# Patient Record
Sex: Female | Born: 1970 | Race: White | Hispanic: No | Marital: Married | Smoking: Never smoker
Health system: Southern US, Community
[De-identification: ages and names within clinical notes are randomized; demographics above are authoritative.]

## PROBLEM LIST (undated history)

## (undated) DIAGNOSIS — F32A Depression, unspecified: Secondary | ICD-10-CM

## (undated) DIAGNOSIS — F419 Anxiety disorder, unspecified: Secondary | ICD-10-CM

## (undated) DIAGNOSIS — G43909 Migraine, unspecified, not intractable, without status migrainosus: Secondary | ICD-10-CM

## (undated) DIAGNOSIS — T7840XA Allergy, unspecified, initial encounter: Secondary | ICD-10-CM

## (undated) DIAGNOSIS — E785 Hyperlipidemia, unspecified: Secondary | ICD-10-CM

## (undated) DIAGNOSIS — K219 Gastro-esophageal reflux disease without esophagitis: Secondary | ICD-10-CM

## (undated) HISTORY — PX: AUGMENTATION MAMMAPLASTY: SUR837

## (undated) HISTORY — DX: Allergy, unspecified, initial encounter: T78.40XA

## (undated) HISTORY — DX: Gastro-esophageal reflux disease without esophagitis: K21.9

## (undated) HISTORY — DX: Migraine, unspecified, not intractable, without status migrainosus: G43.909

## (undated) HISTORY — DX: Hyperlipidemia, unspecified: E78.5

## (undated) HISTORY — DX: Depression, unspecified: F32.A

## (undated) HISTORY — PX: ABDOMINAL HYSTERECTOMY: SHX81

## (undated) HISTORY — DX: Anxiety disorder, unspecified: F41.9

---

## 1998-08-31 ENCOUNTER — Other Ambulatory Visit: Admission: RE | Admit: 1998-08-31 | Discharge: 1998-08-31 | Payer: Self-pay | Admitting: Obstetrics and Gynecology

## 1999-08-23 ENCOUNTER — Other Ambulatory Visit: Admission: RE | Admit: 1999-08-23 | Discharge: 1999-08-23 | Payer: Self-pay | Admitting: Obstetrics and Gynecology

## 1999-11-08 ENCOUNTER — Other Ambulatory Visit: Admission: RE | Admit: 1999-11-08 | Discharge: 1999-11-08 | Payer: Self-pay | Admitting: Obstetrics and Gynecology

## 2001-01-06 ENCOUNTER — Other Ambulatory Visit: Admission: RE | Admit: 2001-01-06 | Discharge: 2001-01-06 | Payer: Self-pay | Admitting: *Deleted

## 2002-01-21 ENCOUNTER — Other Ambulatory Visit: Admission: RE | Admit: 2002-01-21 | Discharge: 2002-01-21 | Payer: Self-pay | Admitting: *Deleted

## 2003-01-10 ENCOUNTER — Other Ambulatory Visit: Admission: RE | Admit: 2003-01-10 | Discharge: 2003-01-10 | Payer: Self-pay | Admitting: *Deleted

## 2004-06-13 ENCOUNTER — Other Ambulatory Visit: Admission: RE | Admit: 2004-06-13 | Discharge: 2004-06-13 | Payer: Self-pay | Admitting: Obstetrics and Gynecology

## 2005-01-16 ENCOUNTER — Other Ambulatory Visit: Admission: RE | Admit: 2005-01-16 | Discharge: 2005-01-16 | Payer: Self-pay | Admitting: Obstetrics and Gynecology

## 2005-04-23 ENCOUNTER — Other Ambulatory Visit: Admission: RE | Admit: 2005-04-23 | Discharge: 2005-04-23 | Payer: Self-pay | Admitting: Obstetrics and Gynecology

## 2006-02-12 ENCOUNTER — Encounter: Admission: RE | Admit: 2006-02-12 | Discharge: 2006-02-12 | Payer: Self-pay | Admitting: Obstetrics and Gynecology

## 2010-01-25 ENCOUNTER — Encounter: Admission: RE | Admit: 2010-01-25 | Discharge: 2010-01-25 | Payer: Self-pay | Admitting: Obstetrics and Gynecology

## 2010-06-30 ENCOUNTER — Encounter: Payer: Self-pay | Admitting: Obstetrics and Gynecology

## 2010-11-21 ENCOUNTER — Other Ambulatory Visit (HOSPITAL_COMMUNITY): Payer: 59

## 2010-11-26 ENCOUNTER — Encounter (HOSPITAL_COMMUNITY)
Admission: RE | Admit: 2010-11-26 | Discharge: 2010-11-26 | Disposition: A | Discharge status: Home / Self Care | Payer: 59 | Source: Ambulatory Visit | Attending: Obstetrics and Gynecology | Admitting: Obstetrics and Gynecology

## 2010-11-26 LAB — BASIC METABOLIC PANEL
CO2: 27 mEq/L (ref 19–32)
Calcium: 9.2 mg/dL (ref 8.4–10.5)
Chloride: 103 mEq/L (ref 96–112)
GFR calc Af Amer: >60 mL/min (ref >60)
Sodium: 138 mEq/L (ref 135–145)

## 2010-11-26 LAB — SURGICAL PCR SCREEN
MRSA, PCR: NEGATIVE
Staphylococcus aureus: NEGATIVE

## 2010-11-26 LAB — CBC
MCH: 30.7 pg (ref 26.0–34.0)
Platelets: 198 10*3/uL (ref 150–400)
RBC: 4.33 MIL/uL (ref 3.87–5.11)
WBC: 4 10*3/uL (ref 4.0–10.5)

## 2010-11-28 ENCOUNTER — Inpatient Hospital Stay (HOSPITAL_COMMUNITY)
Admission: AD | Admit: 2010-11-28 | Discharge: 2010-11-29 | DRG: 743 | Disposition: A | Discharge status: Home / Self Care | Payer: 59 | Source: Ambulatory Visit | Attending: Obstetrics and Gynecology | Admitting: Obstetrics and Gynecology

## 2010-11-28 ENCOUNTER — Other Ambulatory Visit: Payer: Self-pay | Admitting: Obstetrics and Gynecology

## 2010-11-28 DIAGNOSIS — Z5331 Laparoscopic surgical procedure converted to open procedure: Secondary | ICD-10-CM

## 2010-11-28 DIAGNOSIS — N920 Excessive and frequent menstruation with regular cycle: Principal | ICD-10-CM | POA: Diagnosis present

## 2010-11-28 DIAGNOSIS — N8000 Endometriosis of the uterus, unspecified: Secondary | ICD-10-CM | POA: Diagnosis present

## 2010-11-28 DIAGNOSIS — Z01812 Encounter for preprocedural laboratory examination: Secondary | ICD-10-CM

## 2010-11-28 DIAGNOSIS — Z01818 Encounter for other preprocedural examination: Secondary | ICD-10-CM

## 2010-11-28 DIAGNOSIS — D251 Intramural leiomyoma of uterus: Secondary | ICD-10-CM | POA: Diagnosis present

## 2010-11-29 LAB — CBC
MCH: 31 pg (ref 26.0–34.0)
MCHC: 33.8 g/dL (ref 30.0–36.0)
Platelets: 185 10*3/uL (ref 150–400)
RBC: 3.19 MIL/uL — ABNORMAL LOW (ref 3.87–5.11)
RDW: 13.2 % (ref 11.5–15.5)

## 2010-11-29 NOTE — Op Note (Signed)
Darlene Klein, Darlene Klein                ACCOUNT NO.:  [id number]  MEDICAL RECORD NO.:  [id number]  LOCATION:  9312                          FACILITY:  [facility]  PHYSICIAN:  Kaysa Roulhac L. Yancy Knoble, M.D.DATE OF BIRTH:  [birth year]  DATE OF PROCEDURE:  11/28/2010 DATE OF DISCHARGE:                              OPERATIVE REPORT   PREOPERATIVE DIAGNOSES:  Symptomatic fibroid, menorrhagia.  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  PROCEDURES: 1. Laparoscopic-assisted vaginal hysterectomy. 2. Mini laparotomy. 3. Resection of the fibroid in the broad ligament.  SURGEON:  Darlene Atienza L. Vincente Poli, MD  ASSISTANT:  Dr. Marcelle Overlie  ANESTHESIA:  General.  FINDINGS:  A 6-cm fibroid densely adherent to the left side wall not attached to the uterus.  SPECIMENS:  Cervix, uterus and fibroid sent to Pathology.  ESTIMATED BLOOD LOSS:  300 mL.  COMPLICATIONS:  None.  PROCEDURE IN DETAILS:  The patient was taken to the operating room.  She was intubated.  She was then prepped and draped in usual sterile fashion.  A Foley catheter was inserted and draining clear urine.  An uterine manipulator was inserted.  Attention was turned to the abdomen where small infraumbilical incision was made. A Veress needle was inserted and pneumoperitoneum was performed.  After pneumoperitoneum was performed, an 11-mm trocar was inserted.  The patient was gently placed in Trendelenburg position.  The endoscope was introduced into the trocar sheath.  A small 5-mm trocar was placed suprapubically under direct visualization.  Exam of abdomen and pelvis revealed the uterus was enlarged.  The ovaries and tubes were normal.  There was a large bulge in the left broad ligament which was consistent with the broad ligament fibroid.  I could not tell if it is contiguous or attached to the uterus, but it did move when we moved the uterus.  We used  the EnSeal instrument and proceeded to perform the LAVH in the following fashion by identifying the triple pedicle initially on the left and the right grasping that with the EnSeal, burning it and cutting it and carrying it down to the round ligament.  This was done on both sides with excellent hemostasis.  At this point we then went back down to the vagina, placed a weighted speculum in the vagina.  I made a circumferential incision around the cervix.  I entered the posterior and then the anterior cul-de- sac using Mayo scissors.  Curved Heaney clamps were used to place across the uterosacral ligaments.  Each pedicle was clamped, cut and suture ligated using Vicryl suture.  We never ran into the fibroid on the left side so that made me concern that the fibroid was not contiguous with the uterus.  We then proceeded with staying snug against the uterus traveling the uterus.  Each pedicle was clamped, cut and suture ligated using 0 Vicryl suture.  We easily reached the fundus.  We retroflexed the uterus and clamps were remained to the broad ligament on either side and then the specimen was removed.  I did not see a large subserosal fibroid in the specimen but it looked like uterus and cervix.  The pedicles were secured using suture ligature and free tie of 0 Vicryl suture.  At this point ovaries appeared normal and then secured the posterior cuff with 0 Vicryl suture and closed the remainder of the cuff completely anterior to posterior using 0 Vicryl suture.  At this point I went back up to the abdomen, reintroduced the pneumoperitoneum, irrigated the pelvis.  I could still visualize there was very large broad ligament fibroid that was densely adherent to the sidewall.  Based on the vascularity and the location to vital structures, I felt necessary to perform a minilaparotomy because this could not be done via a laparoscopic approach.  I went down to remove the scope and then made a small  minilaparotomy incision, carried it down to the fascia, fascia was scored in the midline, extended laterally.  Rectus muscles were separated in midline.  Peritoneum was entered bluntly.  I then placed a self-retaining retractor in the abdominal cavity, placed a large and small bowel in the upper abdomen.  At this point reinspected the fibroid.  I could grasp it with a single-tooth tenaculum.  I was able to elevate it.  There was a lot of vascularity with this that was feeding off to the sidewall, so carefully used sharp and blunt dissection peeling the fibroid out and having to use curved Heaney clamps to clamp some of the vessels.  Each pedicle was secured using suture ligature of 0 Vicryl suture.  We also paid careful attention to avoid the ureter which was well below where the fibroid was.  Once we resected the fibroid, it was about 6-8 cm in diameter and sent to Pathology.  I then irrigated the pelvis.  There was a small bleeder on the edge of the peritoneum on the left side.  I placed a curved Heaney clamp there and I was secured with a free tie of 0 Vicryl suture.  Remainder of the pelvis was inspected.  Hemostasis was excellent.  Surgicel was placed on the left side wall because where the fibroid was, there was kind of a large roll stayed and then I placed Interceed across the vaginal cuff and across the left side wall to hopefully minimize adhesion formation later on.  All sponges and instruments were removed from the abdominal cavity. The peritoneum was closed using 0 Vicryl.  Rectus muscles were reapproximated with 0 Vicryl, the fascia was closed using 0 Vicryl in a running stitch x2 starting each corner meeting in the midline.  After irrigation of subcutaneous layer, the skin was closed with a 3-0 Vicryl on a Keith needle.  Dermabond was applied and a laparoscopic site was closed with 3-0 Vicryl interrupted.  All sponge, lap and instrument counts were correct x2.  The patient  went to recovery room in stable condition.     Darlene Klein L. Vincente Klein, M.D.     Darlene Klein  D:  11/28/2010  T:  11/29/2010  Job:  [id number]  Electronically Signed by Marcelle Overlie M.D. on 11/29/2010 07:42:59 PM

## 2011-01-01 ENCOUNTER — Other Ambulatory Visit: Payer: Self-pay | Admitting: Obstetrics and Gynecology

## 2011-01-01 DIAGNOSIS — Z1231 Encounter for screening mammogram for malignant neoplasm of breast: Secondary | ICD-10-CM

## 2011-01-29 ENCOUNTER — Ambulatory Visit
Admission: RE | Admit: 2011-01-29 | Discharge: 2011-01-29 | Disposition: A | Discharge status: Home / Self Care | Payer: 59 | Source: Ambulatory Visit | Attending: Obstetrics and Gynecology | Admitting: Obstetrics and Gynecology

## 2011-01-29 DIAGNOSIS — Z1231 Encounter for screening mammogram for malignant neoplasm of breast: Secondary | ICD-10-CM

## 2011-09-25 ENCOUNTER — Other Ambulatory Visit: Payer: Self-pay | Admitting: Obstetrics and Gynecology

## 2012-08-06 ENCOUNTER — Other Ambulatory Visit: Payer: Self-pay | Admitting: Obstetrics and Gynecology

## 2012-08-06 ENCOUNTER — Other Ambulatory Visit: Payer: Self-pay

## 2012-08-06 DIAGNOSIS — Z1231 Encounter for screening mammogram for malignant neoplasm of breast: Secondary | ICD-10-CM

## 2012-09-02 ENCOUNTER — Ambulatory Visit
Admission: RE | Admit: 2012-09-02 | Discharge: 2012-09-02 | Disposition: A | Discharge status: Home / Self Care | Payer: 59 | Source: Ambulatory Visit

## 2012-09-02 DIAGNOSIS — Z1231 Encounter for screening mammogram for malignant neoplasm of breast: Secondary | ICD-10-CM

## 2013-01-26 ENCOUNTER — Other Ambulatory Visit: Payer: Self-pay | Admitting: Obstetrics and Gynecology

## 2013-09-22 ENCOUNTER — Other Ambulatory Visit: Payer: Self-pay

## 2013-09-22 DIAGNOSIS — Z1231 Encounter for screening mammogram for malignant neoplasm of breast: Secondary | ICD-10-CM

## 2013-09-29 ENCOUNTER — Encounter (INDEPENDENT_AMBULATORY_CARE_PROVIDER_SITE_OTHER): Payer: Self-pay

## 2013-09-29 ENCOUNTER — Ambulatory Visit
Admission: RE | Admit: 2013-09-29 | Discharge: 2013-09-29 | Disposition: A | Discharge status: Home / Self Care | Payer: Self-pay | Source: Ambulatory Visit

## 2013-09-29 DIAGNOSIS — Z1231 Encounter for screening mammogram for malignant neoplasm of breast: Secondary | ICD-10-CM

## 2014-10-05 ENCOUNTER — Other Ambulatory Visit: Payer: Self-pay

## 2014-10-05 DIAGNOSIS — Z1231 Encounter for screening mammogram for malignant neoplasm of breast: Secondary | ICD-10-CM

## 2014-10-11 ENCOUNTER — Other Ambulatory Visit: Payer: Self-pay

## 2014-10-11 ENCOUNTER — Ambulatory Visit
Admission: RE | Admit: 2014-10-11 | Discharge: 2014-10-11 | Disposition: A | Discharge status: Home / Self Care | Payer: PRIVATE HEALTH INSURANCE | Source: Ambulatory Visit

## 2014-10-11 DIAGNOSIS — Z1231 Encounter for screening mammogram for malignant neoplasm of breast: Secondary | ICD-10-CM

## 2014-10-31 ENCOUNTER — Other Ambulatory Visit: Payer: Self-pay | Admitting: Obstetrics and Gynecology

## 2014-11-01 LAB — CYTOLOGY - PAP

## 2015-05-09 ENCOUNTER — Ambulatory Visit (INDEPENDENT_AMBULATORY_CARE_PROVIDER_SITE_OTHER): Payer: PRIVATE HEALTH INSURANCE

## 2015-05-09 ENCOUNTER — Encounter: Payer: Self-pay | Admitting: Podiatry

## 2015-05-09 ENCOUNTER — Ambulatory Visit (INDEPENDENT_AMBULATORY_CARE_PROVIDER_SITE_OTHER): Payer: PRIVATE HEALTH INSURANCE | Admitting: Podiatry

## 2015-05-09 VITALS — BP 115/84 | HR 75 | Resp 16

## 2015-05-09 DIAGNOSIS — Q6652 Congenital pes planus, left foot: Secondary | ICD-10-CM

## 2015-05-09 DIAGNOSIS — M722 Plantar fascial fibromatosis: Secondary | ICD-10-CM

## 2015-05-09 MED ORDER — METHYLPREDNISOLONE 4 MG PO TBPK: ORAL_TABLET | ORAL | Status: DC

## 2015-05-09 MED ORDER — MELOXICAM 15 MG PO TABS: 15.0000 mg | ORAL_TABLET | Freq: Every day | ORAL | Status: DC

## 2015-05-09 NOTE — Progress Notes (Signed)
   Subjective:    Patient ID: Darlene Klein, female    DOB: 08/27/1970, 44 y.o.   MRN: [id number]  HPI: She presents today for several month duration of heel pain left. 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 She's tried meloxicam for the past 2 weeks as well as an injection to her left heel which she performed herself with 20 mg of Kenalog and local anesthetic. She's also been using a night brace. She is an avid runner and has discontinued running at this time. She does not recall any initiating event.    Review of Systems  All other systems reviewed and are negative.      Objective:   Physical Exam: 44 year old white female presents with left heel pain times several months vital signs stable alert and oriented 3 in no acute distress. Pulses are strongly palpable left neurologic sensorium is intact deep tendon reflexes are intact muscle strength is normal. She has normal gait. She has pain on palpation medial calcaneal tubercle of her left heel. No tenderness on palpation of her Achilles. Radiographic evaluation demonstrates soft tissue increase in density of the plantar fascial calcaneal insertion site of her left heel. No fractures are identified. Cutaneous evaluation demonstrates supple well-hydrated cutis no erythema or edema cellulitis drainage or odor.        Assessment & Plan:  Plantar fasciitis left foot.  Plan: I offered her another injection to the central band of the left plantar fascia insertion site. She declined. I offered her a plantar fascial night splint and she declined. I placed her in a plantar fascial brace and she was scanned for several orthotics. I also prescribed a Medrol Dosepak to be followed by meloxicam. We discussed appropriate shoe gear to exercises ice therapy and shear modifications. I will follow up with her once her orthotics have arrived.  Roselind Messier DPM

## 2015-05-09 NOTE — Patient Instructions (Signed)

## 2015-06-01 ENCOUNTER — Encounter: Payer: Self-pay | Admitting: Podiatry

## 2015-06-06 ENCOUNTER — Ambulatory Visit: Payer: PRIVATE HEALTH INSURANCE | Admitting: Podiatry

## 2015-06-27 ENCOUNTER — Ambulatory Visit (INDEPENDENT_AMBULATORY_CARE_PROVIDER_SITE_OTHER): Payer: PRIVATE HEALTH INSURANCE | Admitting: Podiatry

## 2015-06-27 ENCOUNTER — Encounter: Payer: Self-pay | Admitting: Podiatry

## 2015-06-27 DIAGNOSIS — M722 Plantar fascial fibromatosis: Secondary | ICD-10-CM | POA: Diagnosis not present

## 2015-06-27 NOTE — Patient Instructions (Signed)

## 2015-06-27 NOTE — Progress Notes (Signed)
She presents today for follow-up of her plantar fasciitis bilaterally. She states that she's doing pretty well. She presents to pick up her orthotics today.  Objective: Vital signs are stable she is alert and oriented 3. Pulses are palpable no pain on palpation medial calcaneal tubercles. Orthotics appear to be fitting correctly.  Assessment: Well-healing plantar fasciitis.  Plan: Start use of orthotics follow up with me in 1 month. She will continue all other conservative therapies will notify us with any questions or concerns.

## 2015-10-08 ENCOUNTER — Other Ambulatory Visit: Payer: Self-pay

## 2015-10-08 DIAGNOSIS — Z1231 Encounter for screening mammogram for malignant neoplasm of breast: Secondary | ICD-10-CM

## 2015-11-09 ENCOUNTER — Ambulatory Visit
Admission: RE | Admit: 2015-11-09 | Discharge: 2015-11-09 | Disposition: A | Discharge status: Home / Self Care | Payer: PRIVATE HEALTH INSURANCE | Source: Ambulatory Visit

## 2015-11-09 DIAGNOSIS — Z1231 Encounter for screening mammogram for malignant neoplasm of breast: Secondary | ICD-10-CM

## 2017-02-23 ENCOUNTER — Other Ambulatory Visit: Payer: Self-pay | Admitting: Physician Assistant

## 2017-02-23 DIAGNOSIS — Z1239 Encounter for other screening for malignant neoplasm of breast: Secondary | ICD-10-CM

## 2017-03-04 ENCOUNTER — Ambulatory Visit
Admission: RE | Admit: 2017-03-04 | Discharge: 2017-03-04 | Disposition: A | Discharge status: Home / Self Care | Payer: Commercial Managed Care - PPO | Source: Ambulatory Visit | Attending: Physician Assistant | Admitting: Physician Assistant

## 2017-03-04 DIAGNOSIS — Z1239 Encounter for other screening for malignant neoplasm of breast: Secondary | ICD-10-CM

## 2018-05-26 ENCOUNTER — Other Ambulatory Visit: Payer: Self-pay | Admitting: Physician Assistant

## 2018-05-26 DIAGNOSIS — Z1231 Encounter for screening mammogram for malignant neoplasm of breast: Secondary | ICD-10-CM

## 2018-07-07 ENCOUNTER — Ambulatory Visit: Payer: Commercial Managed Care - PPO

## 2018-08-04 ENCOUNTER — Ambulatory Visit
Admission: RE | Admit: 2018-08-04 | Discharge: 2018-08-04 | Disposition: A | Discharge status: Home / Self Care | Payer: Commercial Managed Care - PPO | Source: Ambulatory Visit | Attending: Physician Assistant | Admitting: Physician Assistant

## 2018-08-04 DIAGNOSIS — Z1231 Encounter for screening mammogram for malignant neoplasm of breast: Secondary | ICD-10-CM

## 2019-10-06 ENCOUNTER — Other Ambulatory Visit: Payer: Self-pay | Admitting: Physician Assistant

## 2019-10-06 DIAGNOSIS — Z1231 Encounter for screening mammogram for malignant neoplasm of breast: Secondary | ICD-10-CM

## 2019-10-12 ENCOUNTER — Other Ambulatory Visit: Payer: Self-pay | Admitting: Physician Assistant

## 2019-10-12 ENCOUNTER — Other Ambulatory Visit: Payer: Self-pay

## 2019-10-12 ENCOUNTER — Ambulatory Visit
Admission: RE | Admit: 2019-10-12 | Discharge: 2019-10-12 | Disposition: A | Discharge status: Home / Self Care | Payer: Commercial Managed Care - PPO | Source: Ambulatory Visit | Attending: Physician Assistant | Admitting: Physician Assistant

## 2019-10-12 DIAGNOSIS — Z1231 Encounter for screening mammogram for malignant neoplasm of breast: Secondary | ICD-10-CM

## 2019-10-12 DIAGNOSIS — R921 Mammographic calcification found on diagnostic imaging of breast: Secondary | ICD-10-CM

## 2019-10-19 ENCOUNTER — Other Ambulatory Visit: Payer: Self-pay

## 2019-10-19 ENCOUNTER — Other Ambulatory Visit: Payer: Self-pay | Admitting: Physician Assistant

## 2019-10-19 ENCOUNTER — Ambulatory Visit
Admission: RE | Admit: 2019-10-19 | Discharge: 2019-10-19 | Disposition: A | Discharge status: Home / Self Care | Payer: Commercial Managed Care - PPO | Source: Ambulatory Visit | Attending: Physician Assistant | Admitting: Physician Assistant

## 2019-10-19 DIAGNOSIS — R921 Mammographic calcification found on diagnostic imaging of breast: Secondary | ICD-10-CM

## 2020-04-24 ENCOUNTER — Encounter: Payer: Self-pay | Admitting: Gastroenterology

## 2020-06-13 ENCOUNTER — Ambulatory Visit (AMBULATORY_SURGERY_CENTER): Payer: Self-pay

## 2020-06-13 ENCOUNTER — Other Ambulatory Visit: Payer: Self-pay

## 2020-06-13 VITALS — Ht 65.0 in | Wt 154.0 lb

## 2020-06-13 DIAGNOSIS — Z1211 Encounter for screening for malignant neoplasm of colon: Secondary | ICD-10-CM

## 2020-06-13 MED ORDER — SUTAB 1479-225-188 MG PO TABS
12.0000 | ORAL_TABLET | ORAL | 0 refills | Status: DC
Start: 2020-06-13 — End: 2020-06-27

## 2020-06-13 NOTE — Progress Notes (Signed)
No allergies to soy or egg Pt is not on blood thinners or diet pills Denies issues with sedation/intubation Denies atrial flutter/fib Chronic constipation-takes laxative every couple of days. Prep changed to 2 day.  Emmi instructions given to pt  Pt is aware of Covid safety and care partner requirements.

## 2020-06-25 ENCOUNTER — Encounter: Payer: Self-pay | Admitting: Gastroenterology

## 2020-06-27 ENCOUNTER — Ambulatory Visit (AMBULATORY_SURGERY_CENTER): Payer: Commercial Managed Care - PPO | Admitting: Gastroenterology

## 2020-06-27 ENCOUNTER — Encounter: Payer: Self-pay | Admitting: Gastroenterology

## 2020-06-27 ENCOUNTER — Other Ambulatory Visit: Payer: Self-pay

## 2020-06-27 VITALS — BP 118/76 | HR 64 | Temp 97.3°F | Resp 15 | Ht 65.0 in | Wt 154.0 lb

## 2020-06-27 DIAGNOSIS — Z1211 Encounter for screening for malignant neoplasm of colon: Secondary | ICD-10-CM

## 2020-06-27 MED ORDER — SODIUM CHLORIDE 0.9 % IV SOLN: 500.0000 mL | Freq: Once | INTRAVENOUS | Status: DC

## 2020-06-27 NOTE — Patient Instructions (Signed)
Discharge instructions given. Normal exam. Resume previous medications. YOU HAD AN ENDOSCOPIC PROCEDURE TODAY AT THE  ENDOSCOPY CENTER:   Refer to the procedure report that was given to you for any specific questions about what was found during the examination.  If the procedure report does not answer your questions, please call your gastroenterologist to clarify.  If you requested that your care partner not be given the details of your procedure findings, then the procedure report has been included in a sealed envelope for you to review at your convenience later.  YOU SHOULD EXPECT: Some feelings of bloating in the abdomen. Passage of more gas than usual.  Walking can help get rid of the air that was put into your GI tract during the procedure and reduce the bloating. If you had a lower endoscopy (such as a colonoscopy or flexible sigmoidoscopy) you may notice spotting of blood in your stool or on the toilet paper. If you underwent a bowel prep for your procedure, you may not have a normal bowel movement for a few days.  Please Note:  You might notice some irritation and congestion in your nose or some drainage.  This is from the oxygen used during your procedure.  There is no need for concern and it should clear up in a day or so.  SYMPTOMS TO REPORT IMMEDIATELY:  Following lower endoscopy (colonoscopy or flexible sigmoidoscopy):  Excessive amounts of blood in the stool  Significant tenderness or worsening of abdominal pains  Swelling of the abdomen that is new, acute  Fever of 100F or higher   For urgent or emergent issues, a gastroenterologist can be reached at any hour by calling [phone]. Do not use MyChart messaging for urgent concerns.    DIET:  We do recommend a small meal at first, but then you may proceed to your regular diet.  Drink plenty of fluids but you should avoid alcoholic beverages for 24 hours.  ACTIVITY:  You should plan to take it easy for the rest of  today and you should NOT DRIVE or use heavy machinery until tomorrow (because of the sedation medicines used during the test).    FOLLOW UP: Our staff will call the number listed on your records 48-72 hours following your procedure to check on you and address any questions or concerns that you may have regarding the information given to you following your procedure. If we do not reach you, we will leave a message.  We will attempt to reach you two times.  During this call, we will ask if you have developed any symptoms of COVID 19. If you develop any symptoms (ie: fever, flu-like symptoms, shortness of breath, cough etc.) before then, please call [phone].  If you test positive for Covid 19 in the 2 weeks post procedure, please call and report this information to us.    If any biopsies were taken you will be contacted by phone or by letter within the next 1-3 weeks.  Please call us at [phone] if you have not heard about the biopsies in 3 weeks.    SIGNATURES/CONFIDENTIALITY: You and/or your care partner have signed paperwork which will be entered into your electronic medical record.  These signatures attest to the fact that that the information above on your After Visit Summary has been reviewed and is understood.  Full responsibility of the confidentiality of this discharge information lies with you and/or your care-partner.  

## 2020-06-27 NOTE — Progress Notes (Signed)
A/ox3, pleased with MAC, report to RN 

## 2020-06-27 NOTE — Op Note (Signed)
[facility] Patient Name: Darlene Klein Procedure Date: 06/27/2020 1:31 PM MRN: [id number] Endoscopist: Thornton Park MD, MD Age: 50 Referring MD:  Date of Birth: [birth year] Gender: Female Account #: [id number] Procedure:                Colonoscopy Indications:              Screening for colorectal malignant neoplasm, This                            is the patient's first colonoscopy                           No known family history of colon cancer or polyps                           Incidental history: chronic constipation Medicines:                Monitored Anesthesia Care Procedure:                Pre-Anesthesia Assessment:                           - Prior to the procedure, a History and Physical                            was performed, and patient medications and                            allergies were reviewed. The patient's tolerance of                            previous anesthesia was also reviewed. The risks                            and benefits of the procedure and the sedation                            options and risks were discussed with the patient.                            All questions were answered, and informed consent                            was obtained. Prior Anticoagulants: The patient has                            taken no previous anticoagulant or antiplatelet                            agents. ASA Grade Assessment: II - A patient with                            mild systemic disease. After reviewing the risks  and benefits, the patient was deemed in                            satisfactory condition to undergo the procedure.                           After obtaining informed consent, the colonoscope                            was passed under direct vision. Throughout the                            procedure, the patient's blood pressure, pulse, and                            oxygen saturations were monitored  continuously. The                            Olympus CF-HQ190L [id number]) Colonoscope was                            introduced through the anus and advanced to the the                            cecum, identified by appendiceal orifice and                            ileocecal valve. The colonoscopy was technically                            difficult and complex due to a redundant colon,                            significant looping and a tortuous colon.                            Successful completion of the procedure was aided by                            changing the patient's position, withdrawing and                            reinserting the scope and applying abdominal                            pressure. The patient tolerated the procedure well.                            The quality of the bowel preparation was good. The                            ileocecal valve, appendiceal orifice, and rectum  were photographed. Scope In: 1:39:50 PM Scope Out: 2:05:21 PM Scope Withdrawal Time: 0 hours 15 minutes 16 seconds  Total Procedure Duration: 0 hours 25 minutes 31 seconds  Findings:                 The perianal and digital rectal examinations were                            normal except for small hemorrhoids.                           The entire examined colon appeared normal on direct                            and retroflexion views. However, the colon is                            tortuous and redundant. Complications:            No immediate complications. Estimated Blood Loss:     Estimated blood loss: none. Impression:               - Tortuous and redundant colon. Otherwise, normal                            colonic mucosa.                           - No specimens collected. Recommendation:           - Patient has a contact number available for                            emergencies. The signs and symptoms of potential                             delayed complications were discussed with the                            patient. Return to normal activities tomorrow.                            Written discharge instructions were provided to the                            patient.                           - Resume previous diet.                           - Continue present medications.                           - Repeat colonoscopy in 10 years for screening                            purposes.                           -  Emerging evidence supports eating a diet of                            fruits, vegetables, grains, calcium, and yogurt                            while reducing red meat and alcohol may reduce the                            risk of colon cancer.                           - Thank you for allowing me to be involved in your                            colon cancer prevention. Thornton Park MD, MD 06/27/2020 2:12:23 PM This report has been signed electronically.

## 2020-06-27 NOTE — Progress Notes (Signed)
Pt's states no medical or surgical changes since previsit or office visit.   VS taken by CW 

## 2020-06-29 ENCOUNTER — Telehealth: Payer: Self-pay

## 2020-06-29 NOTE — Telephone Encounter (Signed)
°  Follow up Call-  Call back number 06/27/2020  Post procedure Call Back phone  # [phone]  Permission to leave phone message Yes  Some recent data might be hidden     Patient questions:  Do you have a fever, pain , or abdominal swelling? No. Pain Score  0 *  Have you tolerated food without any problems? Yes.    Have you been able to return to your normal activities? Yes.    Do you have any questions about your discharge instructions: Diet   No. Medications  No. Follow up visit  No.  Do you have questions or concerns about your Care? No.  Actions: * If pain score is 4 or above: No action needed, pain <4. 1. Have you developed a fever since your procedure? no  2.   Have you had an respiratory symptoms (SOB or cough) since your procedure? no  3.   Have you tested positive for COVID 19 since your procedure no  4.   Have you had any family members/close contacts diagnosed with the COVID 19 since your procedure?  no   If yes to any of these questions please route to Joylene John, RN and Joella Prince, RN

## 2020-09-19 ENCOUNTER — Other Ambulatory Visit: Payer: Self-pay | Admitting: Physician Assistant

## 2020-09-19 DIAGNOSIS — Z1231 Encounter for screening mammogram for malignant neoplasm of breast: Secondary | ICD-10-CM

## 2020-11-19 ENCOUNTER — Ambulatory Visit: Payer: Commercial Managed Care - PPO

## 2021-01-09 ENCOUNTER — Other Ambulatory Visit: Payer: Self-pay

## 2021-01-09 ENCOUNTER — Ambulatory Visit
Admission: RE | Admit: 2021-01-09 | Discharge: 2021-01-09 | Disposition: A | Discharge status: Home / Self Care | Payer: Commercial Managed Care - PPO | Source: Ambulatory Visit | Attending: Physician Assistant | Admitting: Physician Assistant

## 2021-01-09 DIAGNOSIS — Z1231 Encounter for screening mammogram for malignant neoplasm of breast: Secondary | ICD-10-CM

## 2021-04-08 NOTE — Progress Notes (Signed)
Cardiology Office Note:    Date:  04/09/2021   ID:  Darlene Klein, DOB 12-28-1970, MRN [id number]  PCP:  Cyndi Bender, PA-C   [facility] Providers Cardiologist:  None     Referring MD: Cyndi Bender, PA-C   No chief complaint on file.  History of Present Illness:    Darlene Klein is a 50 y.o. female with a hx of hyperlipidemia, GERD, anxiety, and depression, here for the evaluation of palpitations. She saw her PCP 01/2021 and reported tachycardia. She noted acute onset of weakness and exercise intolerance. Her smart watch was concerning for possible atrial fibrillation. At the time of the exam she was in sinus rhythm. She was started on rosuvastatin and referred to cardiology.  Today, she reports that she has been feeling like her heart is beating out of rhythm, but not necessarily tachycardic. Her episodes have lasted as long as a couple hours. She has associated fatigue and feeling like she needs to cough. When she develops a migraine, she treats this with an injection of sumatriptan. She believes this then triggers an episode of palpitations. For exercise, she is riding 45 minutes on a Peloton bike for 4-5 days a week. She also performs yoga exercises. Despite her frequent exercising, she does not feel like she is improving her endurance. This has remained unchanged, and she does not believe her endurance is decreasing. For her diet, she is drinking a diet [facility] and a cup of tea every day. She does not use any supplements. She denies any chest pain, or shortness of breath. No lightheadedness, syncope, orthopnea, PND, lower extremity edema or exertional symptoms.   Past Medical History:  Diagnosis Date   Allergy    seasonal   Anxiety    Depression    GERD (gastroesophageal reflux disease)    Hyperlipidemia    Migraines    Tachycardia 04/09/2021    Past Surgical History:  Procedure Laterality Date   ABDOMINAL HYSTERECTOMY     AUGMENTATION MAMMAPLASTY Bilateral     Current  Medications: Current Meds  Medication Sig   busPIRone (BUSPAR) 15 MG tablet Take 1 tablet by mouth in the morning and at bedtime.   Coenzyme Q10 (CO Q-10) 200 MG CAPS Take 1 capsule by mouth daily.   DULoxetine (CYMBALTA) 60 MG capsule Take 60 mg by mouth daily.   estradiol (VIVELLE-DOT) 0.05 MG/24HR patch estradiol 0.05 mg/24 hr semiweekly transdermal patch  Apply 1 patch twice a week by transdermal route for 28 days.   Fexofenadine-Pseudoephedrine (ALLEGRA-D 24 HOUR PO) Take 1 tablet by mouth daily.   ibuprofen (ADVIL,MOTRIN) 200 MG tablet Take 200 mg by mouth every 6 (six) hours as needed.   rosuvastatin (CRESTOR) 5 MG tablet Take 1 tablet by mouth daily.   SUMAtriptan (IMITREX) 6 MG/0.5ML SOLN injection Inject 6 mg into the skin every 2 (two) hours as needed for migraine or headache. May repeat in 2 hours if headache persists or recurs.   zolpidem (AMBIEN) 5 MG tablet Take 5 mg by mouth at bedtime as needed for sleep.     Allergies:   Adhesive [tape], Insect extract allergy skin test, and Neomycin   Social History   Socioeconomic History   Marital status: Married    Spouse name: Not on file   Number of children: Not on file   Years of education: Not on file   Highest education level: Not on file  Occupational History   Not on file  Tobacco Use  Smoking status: Never   Smokeless tobacco: Never  Vaping Use   Vaping Use: Never used  Substance and Sexual Activity   Alcohol use: Yes    Alcohol/week: 0.0 standard drinks    Comment: infrequently   Drug use: Never   Sexual activity: Not on file  Other Topics Concern   Not on file  Social History Narrative   Not on file   Social Determinants of Health   Financial Resource Strain: Low Risk    Difficulty of Paying Living Expenses: Not hard at all  Food Insecurity: No Food Insecurity   Worried About Charity fundraiser in the Last Year: Never true   [place] in the Last Year: Never true  Transportation Needs: No  Transportation Needs   Lack of Transportation (Medical): No   Lack of Transportation (Non-Medical): No  Physical Activity: Sufficiently Active   Days of Exercise per Week: 5 days   Minutes of Exercise per Session: 60 min  Stress: Not on file  Social Connections: Not on file     Family History: The patient's family history includes Aortic stenosis in her father; Atrial fibrillation in her mother; Hyperlipidemia in her father and mother. There is no history of Colon cancer, Colon polyps, Esophageal cancer, Rectal cancer, or Stomach cancer.  ROS:   Please see the history of present illness.    (+) Palpitations (+) Fatigue (+) Migraines All other systems reviewed and are negative.  EKGs/Labs/Other Studies Reviewed:    The following studies were reviewed today: No prior cardiovascular studies available.  EKG:    04/09/2021: Sinus rhythm. Rate 68 bpm.  Recent Labs: No results found for requested labs within last 8760 hours.   02/05/2021: Total cholesterol 163, triglycerides 61, HDL 59, LDL 92 TSH 1.59 WBC 4.9, hemoglobin 13.7, hematocrit 40.4, platelets 193 Sodium 138, potassium 3.8, BUN 14, creatinine 0.73 AST 20, ALT of 60  Recent Lipid Panel No results found for: CHOL, TRIG, HDL, CHOLHDL, VLDL, LDLCALC, LDLDIRECT   Physical Exam:    Wt Readings from Last 3 Encounters:  06/27/20 154 lb (69.9 kg)  06/13/20 154 lb (69.9 kg)   VS:  BP 126/88 (BP Location: Right Arm, Patient Position: Sitting, Cuff Size: Normal)   Pulse 68   Ht 5\' 5"  (1.651 m)   BMI 25.63 kg/m  , BMI Body mass index is 25.63 kg/m. GENERAL:  Well appearing HEENT: Pupils equal round and reactive, fundi not visualized, oral mucosa unremarkable NECK:  No jugular venous distention, waveform within normal limits, carotid upstroke brisk and symmetric, no bruits, no thyromegaly LUNGS:  Clear to auscultation bilaterally HEART:  RRR.  PMI not displaced or sustained,S1 and S2 within normal limits, no S3, no S4,  no clicks, no rubs, no murmurs ABD:  Flat, positive bowel sounds normal in frequency in pitch, no bruits, no rebound, no guarding, no midline pulsatile mass, no hepatomegaly, no splenomegaly EXT:  2 plus pulses throughout, no edema, no cyanosis no clubbing SKIN:  No rashes no nodules NEURO:  Cranial nerves II through XII grossly intact, motor grossly intact throughout PSYCH:  Cognitively intact, oriented to person place and time   ASSESSMENT:    1. Tachycardia   2. Dyspnea on exertion   3. Pure hypercholesterolemia     PLAN:    Tachycardia Ms. Baugher is having episodes of tachycardia that last for hours at a time and associated with fatigue.  When she checks her heart rate is irregular.  She brought an  example from her smart watch today which appears to be sinus with PACs, though the baseline of the tracing is not entirely clear.  We discussed the fact that given that her only risk factor is female gender, even if she were having atrial fibrillation she would not need anticoagulation at this point.  We will continue to try and track the episodes on her watch and have also discussed getting a Kardia mobile device, possibly 1 that allows for more than 1-lead.  Given that the episodes occur so infrequently, wearing an ambulatory monitor is not a great idea.  She has had lab work drawn that has been reportedly normal.  I did give her metoprolol to tartrate 25 mg to take as needed if she has a sustained episode.  Dyspnea on exertion Ms. Leon has exertional shortness of breath and fatigue despite exercising regularly.  We will get a coronary CTA to assess for obstructive coronary disease as well as to help [place] determine how aggressive to be about her lipid management.  Hyperlipidemia Lipids have been poorly controlled.  She is on rosuvastatin 5 mg daily.  She previously did not tolerate atorvastatin due to myalgias and still has some leg discomfort on the rosuvastatin.  Her most recent LDL was 92 prior  to making the switch.  We will have her repeat fasting lipids and a CMP when she gets her lab work for her CT.    Disposition: FU with Sherwood Castilla C. Oval Linsey, MD, [facility] in 2 months.  Medication Adjustments/Labs and Tests Ordered: Current medicines are reviewed at length with the patient today.  Concerns regarding medicines are outlined above.   No orders of the defined types were placed in this encounter.  No orders of the defined types were placed in this encounter.  There are no Patient Instructions on file for this visit.   I,Mathew Stumpf,acting as a Education administrator for Skeet Latch, MD.,have documented all relevant documentation on the behalf of Skeet Latch, MD,as directed by  Skeet Latch, MD while in the presence of Skeet Latch, MD.  I, [place] Oval Linsey, MD have reviewed all documentation for this visit.  The documentation of the exam, diagnosis, procedures, and orders on 04/09/2021 are all accurate and complete.   Signed, Skeet Latch, MD  04/09/2021 10:23 AM    [facility]

## 2021-04-09 ENCOUNTER — Ambulatory Visit (HOSPITAL_BASED_OUTPATIENT_CLINIC_OR_DEPARTMENT_OTHER): Payer: Commercial Managed Care - PPO | Admitting: Cardiovascular Disease

## 2021-04-09 ENCOUNTER — Encounter (HOSPITAL_BASED_OUTPATIENT_CLINIC_OR_DEPARTMENT_OTHER): Payer: Self-pay | Admitting: Cardiovascular Disease

## 2021-04-09 ENCOUNTER — Other Ambulatory Visit: Payer: Self-pay

## 2021-04-09 DIAGNOSIS — R000 Tachycardia, unspecified: Secondary | ICD-10-CM | POA: Diagnosis not present

## 2021-04-09 DIAGNOSIS — E7800 Pure hypercholesterolemia, unspecified: Secondary | ICD-10-CM

## 2021-04-09 DIAGNOSIS — R0609 Other forms of dyspnea: Secondary | ICD-10-CM

## 2021-04-09 DIAGNOSIS — E785 Hyperlipidemia, unspecified: Secondary | ICD-10-CM | POA: Insufficient documentation

## 2021-04-09 HISTORY — DX: Tachycardia, unspecified: R00.0

## 2021-04-09 MED ORDER — METOPROLOL TARTRATE 25 MG PO TABS: ORAL_TABLET | ORAL | 0 refills | Status: DC

## 2021-04-09 NOTE — Patient Instructions (Addendum)
Medication Instructions:  TAKE METOPROLOL TART 25 MG AS NEEDED FOR PALPITATIONS/TACHYCARDIA   TAKE 2 METOPROLOL 2 HOURS PRIOR TO CT   *If you need a refill on your cardiac medications before your next appointment, please call your pharmacy*  Lab Work: FASTING LP/CMET 1 WEEK PRIOR TO CT   If you have labs (blood work) drawn today and your tests are completely normal, you will receive your results only by: [place] (if you have MyChart) OR A paper copy in the mail If you have any lab test that is abnormal or we need to change your treatment, we will call you to review the results.   Testing/Procedures: Your physician has requested that you have cardiac CT. Cardiac computed tomography (CT) is a painless test that uses an x-ray machine to take clear, detailed pictures of your heart. For further information please visit [email or web address]. Please follow instruction sheet as given.  ONCE INSURANCE HAS BEEN REVIEWED THE OFFICE WILL CALL YOU TO ARRANGE  IF YOU DO NOT HEAR IN 2 WEEKS CALL THE OFFICE TO FOLLOW UP    Follow-Up: At [facility], you and your health needs are our priority.  As part of our continuing mission to provide you with exceptional heart care, we have created designated Provider Care Teams.  These Care Teams include your primary Cardiologist (physician) and Advanced Practice Providers (APPs -  Physician Assistants and Nurse Practitioners) who all work together to provide you with the care you need, when you need it.  We recommend signing up for the patient portal called "MyChart".  Sign up information is provided on this After Visit Summary.  MyChart is used to connect with patients for Virtual Visits (Telemedicine).  Patients are able to view lab/test results, encounter notes, upcoming appointments, etc.  Non-urgent messages can be sent to your provider as well.   To learn more about what you can do with MyChart, go to [email or web address].    Your next  appointment:   2 month(s)  The format for your next appointment:   In Person  Provider:   Skeet Latch, MD   Other Instructions  CONSIDER [place]     Your cardiac CT will be scheduled at one of the below locations:   [facility] [address] [place], [place] [address] [phone]  [place] [address] [place], [place] [address] [phone]  If scheduled at [facility], please arrive at the [facility] main entrance (entrance A) of [facility] 30 minutes prior to test start time. You can use the FREE valet parking offered at the main entrance (encouraged to control the heart rate for the test) Proceed to the [facility] Radiology Department (first floor) to check-in and test prep.  If scheduled at [facility], please arrive 15 mins early for check-in and test prep.  Please follow these instructions carefully (unless otherwise directed):  Hold all erectile dysfunction medications at least 3 days (72 hrs) prior to test.  On the Night Before the Test: Be sure to Drink plenty of water. Do not consume any caffeinated/decaffeinated beverages or chocolate 12 hours prior to your test. Do not take any antihistamines 12 hours prior to your test. If the patient has contrast allergy: Patient will need a prescription for Prednisone and very clear instructions (as follows): Prednisone 50 mg - take 13 hours prior to test Take another Prednisone 50 mg 7 hours prior to test Take  another Prednisone 50 mg 1 hour prior to test Take Benadryl 50 mg 1 hour prior to test Patient must complete all four doses of above prophylactic medications. Patient will need a ride after test due to Benadryl.  On the Day of the Test: Drink plenty of water until 1 hour prior to the test. Do not eat any food 4 hours prior to the test. You may take your regular medications  prior to the test.  Take metoprolol (Lopressor) two hours prior to test. HOLD Furosemide/Hydrochlorothiazide morning of the test. FEMALES- please wear underwire-free bra if available, avoid dresses & tight clothing      After the Test: Drink plenty of water. After receiving IV contrast, you may experience a mild flushed feeling. This is normal. On occasion, you may experience a mild rash up to 24 hours after the test. This is not dangerous. If this occurs, you can take Benadryl 25 mg and increase your fluid intake. If you experience trouble breathing, this can be serious. If it is severe call 911 IMMEDIATELY. If it is mild, please call our office. If you take any of these medications: Glipizide/Metformin, Avandament, Glucavance, please do not take 48 hours after completing test unless otherwise instructed.  Please allow 2-4 weeks for scheduling of routine cardiac CTs. Some insurance companies require a pre-authorization which may delay scheduling of this test.   For non-scheduling related questions, please contact the cardiac imaging nurse navigator should you have any questions/concerns: Marchia Bond, Cardiac Imaging Nurse Navigator Gordy Clement, Cardiac Imaging Nurse Navigator [place] [facility] Direct Office Dial: [phone]   For scheduling needs, including cancellations and rescheduling, please call [place], [phone].  Cardiac CT Angiogram A cardiac CT angiogram is a procedure to look at the heart and the area around the heart. It may be done to help find the cause of chest pains or other symptoms of heart disease. During this procedure, a substance called contrast dye is injected into the blood vessels in the area to be checked. A large X-ray machine, called a CT scanner, then takes detailed pictures of the heart and the surrounding area. The procedure is also sometimes called a coronary CT angiogram, coronary artery scanning, or CTA. A cardiac CT angiogram  allows the health care provider to see how well blood is flowing to and from the heart. The health care provider will be able to see if there are any problems, such as: Blockage or narrowing of the coronary arteries in the heart. Fluid around the heart. Signs of weakness or disease in the muscles, valves, and tissues of the heart. Tell a health care provider about: Any allergies you have. This is especially important if you have had a previous allergic reaction to contrast dye. All medicines you are taking, including vitamins, herbs, eye drops, creams, and over-the-counter medicines. Any blood disorders you have. Any surgeries you have had. Any medical conditions you have. Whether you are pregnant or may be pregnant. Any anxiety disorders, chronic pain, or other conditions you have that may increase your stress or prevent you from lying still. What are the risks? Generally, this is a safe procedure. However, problems may occur, including: Bleeding. Infection. Allergic reactions to medicines or dyes. Damage to other structures or organs. Kidney damage from the contrast dye that is used. Increased risk of cancer from radiation exposure. This risk is low. Talk with your health care provider about: The risks and benefits of testing. How you can receive the lowest dose of  radiation. What happens before the procedure? Wear comfortable clothing and remove any jewelry, glasses, dentures, and hearing aids. Follow instructions from your health care provider about eating and drinking. This may include: For 12 hours before the procedure -- avoid caffeine. This includes tea, coffee, soda, energy drinks, and diet pills. Drink plenty of water or other fluids that do not have caffeine in them. Being well hydrated can prevent complications. For 4-6 hours before the procedure -- stop eating and drinking. The contrast dye can cause nausea, but this is less likely if your stomach is empty. Ask your health  care provider about changing or stopping your regular medicines. This is especially important if you are taking diabetes medicines, blood thinners, or medicines to treat problems with erections (erectile dysfunction). What happens during the procedure?  Hair on your chest may need to be removed so that small sticky patches called electrodes can be placed on your chest. These will transmit information that helps to monitor your heart during the procedure. An IV will be inserted into one of your veins. You might be given a medicine to control your heart rate during the procedure. This will help to ensure that good images are obtained. You will be asked to lie on an exam table. This table will slide in and out of the CT machine during the procedure. Contrast dye will be injected into the IV. You might feel warm, or you may get a metallic taste in your mouth. You will be given a medicine called nitroglycerin. This will relax or dilate the arteries in your heart. The table that you are lying on will move into the CT machine tunnel for the scan. The person running the machine will give you instructions while the scans are being done. You may be asked to: Keep your arms above your head. Hold your breath. Stay very still, even if the table is moving. When the scanning is complete, you will be moved out of the machine. The IV will be removed. The procedure may vary among health care providers and hospitals. What can I expect after the procedure? After your procedure, it is common to have: A metallic taste in your mouth from the contrast dye. A feeling of warmth. A headache from the nitroglycerin. Follow these instructions at home: Take over-the-counter and prescription medicines only as told by your health care provider. If you are told, drink enough fluid to keep your urine pale yellow. This will help to flush the contrast dye out of your body. Most people can return to their normal activities right  after the procedure. Ask your health care provider what activities are safe for you. It is up to you to get the results of your procedure. Ask your health care provider, or the department that is doing the procedure, when your results will be ready. Keep all follow-up visits as told by your health care provider. This is important. Contact a health care provider if: You have any symptoms of allergy to the contrast dye. These include: Shortness of breath. Rash or hives. A racing heartbeat. Summary A cardiac CT angiogram is a procedure to look at the heart and the area around the heart. It may be done to help find the cause of chest pains or other symptoms of heart disease. During this procedure, a large X-ray machine, called a CT scanner, takes detailed pictures of the heart and the surrounding area after a contrast dye has been injected into blood vessels in the area. Ask your  health care provider about changing or stopping your regular medicines before the procedure. This is especially important if you are taking diabetes medicines, blood thinners, or medicines to treat erectile dysfunction. If you are told, drink enough fluid to keep your urine pale yellow. This will help to flush the contrast dye out of your body. This information is not intended to replace advice given to you by your health care provider. Make sure you discuss any questions you have with your health care provider. Document Revised: 01/19/2019 Document Reviewed: 01/19/2019 Elsevier Patient Education  [place].

## 2021-04-09 NOTE — Assessment & Plan Note (Signed)
Darlene Klein is having episodes of tachycardia that last for hours at a time and associated with fatigue.  When she checks her heart rate is irregular.  She brought an example from her smart watch today which appears to be sinus with PACs, though the baseline of the tracing is not entirely clear.  We discussed the fact that given that her only risk factor is female gender, even if she were having atrial fibrillation she would not need anticoagulation at this point.  We will continue to try and track the episodes on her watch and have also discussed getting a Kardia mobile device, possibly 1 that allows for more than 1-lead.  Given that the episodes occur so infrequently, wearing an ambulatory monitor is not a great idea.  She has had lab work drawn that has been reportedly normal.  I did give her metoprolol to tartrate 25 mg to take as needed if she has a sustained episode.

## 2021-04-09 NOTE — Assessment & Plan Note (Signed)
Ms. Vora has exertional shortness of breath and fatigue despite exercising regularly.  We will get a coronary CTA to assess for obstructive coronary disease as well as to help [place] determine how aggressive to be about her lipid management.

## 2021-04-09 NOTE — Assessment & Plan Note (Signed)
Lipids have been poorly controlled.  She is on rosuvastatin 5 mg daily.  She previously did not tolerate atorvastatin due to myalgias and still has some leg discomfort on the rosuvastatin.  Her most recent LDL was 92 prior to making the switch.  We will have her repeat fasting lipids and a CMP when she gets her lab work for her CT.

## 2021-04-10 ENCOUNTER — Encounter (HOSPITAL_BASED_OUTPATIENT_CLINIC_OR_DEPARTMENT_OTHER): Payer: Self-pay | Admitting: *Deleted

## 2021-04-12 ENCOUNTER — Encounter (HOSPITAL_BASED_OUTPATIENT_CLINIC_OR_DEPARTMENT_OTHER): Payer: Self-pay

## 2021-04-15 LAB — LIPID PANEL
Chol/HDL Ratio: 2.8 ratio (ref 0.0–4.4)
Cholesterol, Total: 189 mg/dL (ref 100–199)
HDL: 67 mg/dL (ref >39)
LDL Chol Calc (NIH): 111 mg/dL — ABNORMAL HIGH (ref 0–99)
Triglycerides: 57 mg/dL (ref 0–149)
VLDL Cholesterol Cal: 11 mg/dL (ref 5–40)

## 2021-04-15 LAB — COMPREHENSIVE METABOLIC PANEL
ALT: 26 IU/L (ref 0–32)
AST: 23 IU/L (ref 0–40)
Albumin/Globulin Ratio: 2.3 — ABNORMAL HIGH (ref 1.2–2.2)
Albumin: 4.2 g/dL (ref 3.8–4.9)
Alkaline Phosphatase: 52 IU/L (ref 44–121)
BUN/Creatinine Ratio: 13 (ref 9–23)
BUN: 12 mg/dL (ref 6–24)
Bilirubin Total: 0.6 mg/dL (ref 0.0–1.2)
CO2: 25 mmol/L (ref 20–29)
Calcium: 9.1 mg/dL (ref 8.7–10.2)
Chloride: 103 mmol/L (ref 96–106)
Creatinine, Ser: 0.94 mg/dL (ref 0.57–1.00)
Globulin, Total: 1.8 g/dL (ref 1.5–4.5)
Glucose: 88 mg/dL (ref 70–99)
Potassium: 4.4 mmol/L (ref 3.5–5.2)
Sodium: 140 mmol/L (ref 134–144)
Total Protein: 6 g/dL (ref 6.0–8.5)
eGFR: 73 mL/min/{1.73_m2} (ref >59)

## 2021-04-17 ENCOUNTER — Telehealth (HOSPITAL_COMMUNITY): Payer: Self-pay | Admitting: Emergency Medicine

## 2021-04-17 NOTE — Telephone Encounter (Signed)
Reaching out to patient to offer assistance regarding upcoming cardiac imaging study; pt verbalizes understanding of appt date/time, parking situation and where to check in, pre-test NPO status and medications ordered, and verified current allergies; name and call back number provided for further questions should they arise Marchia Bond RN Navigator Cardiac Imaging [place] [facility] [phone] office [phone] cell  Pt prefers to take 25mg  metoprolol for HR control prior to scan (HR during OV was 68bpm). Pt denies iv issues

## 2021-04-19 ENCOUNTER — Ambulatory Visit (HOSPITAL_COMMUNITY)
Admission: RE | Admit: 2021-04-19 | Discharge: 2021-04-19 | Disposition: A | Discharge status: Home / Self Care | Payer: Commercial Managed Care - PPO | Source: Ambulatory Visit | Attending: Cardiovascular Disease | Admitting: Cardiovascular Disease

## 2021-04-19 ENCOUNTER — Other Ambulatory Visit: Payer: Self-pay

## 2021-04-19 DIAGNOSIS — E7800 Pure hypercholesterolemia, unspecified: Secondary | ICD-10-CM | POA: Insufficient documentation

## 2021-04-19 DIAGNOSIS — R000 Tachycardia, unspecified: Secondary | ICD-10-CM

## 2021-04-19 DIAGNOSIS — R0609 Other forms of dyspnea: Secondary | ICD-10-CM | POA: Insufficient documentation

## 2021-04-19 MED ORDER — NITROGLYCERIN 0.4 MG SL SUBL
SUBLINGUAL_TABLET | SUBLINGUAL | Status: AC
  Filled 2021-04-19: qty 2

## 2021-04-19 MED ORDER — NITROGLYCERIN 0.4 MG SL SUBL
0.8000 mg | SUBLINGUAL_TABLET | Freq: Once | SUBLINGUAL | Status: AC
  Administered 2021-04-19: 0.8 mg via SUBLINGUAL

## 2021-04-19 MED ORDER — IOHEXOL 350 MG/ML SOLN
95.0000 mL | Freq: Once | INTRAVENOUS | Status: AC | PRN
  Administered 2021-04-19: 95 mL via INTRAVENOUS

## 2021-06-14 ENCOUNTER — Encounter (HOSPITAL_BASED_OUTPATIENT_CLINIC_OR_DEPARTMENT_OTHER): Payer: Self-pay

## 2021-06-14 ENCOUNTER — Ambulatory Visit (HOSPITAL_BASED_OUTPATIENT_CLINIC_OR_DEPARTMENT_OTHER): Payer: Commercial Managed Care - PPO | Admitting: Cardiovascular Disease

## 2022-02-11 ENCOUNTER — Other Ambulatory Visit: Payer: Self-pay | Admitting: Physician Assistant

## 2022-02-11 DIAGNOSIS — Z1231 Encounter for screening mammogram for malignant neoplasm of breast: Secondary | ICD-10-CM

## 2022-02-28 ENCOUNTER — Other Ambulatory Visit: Payer: Self-pay | Admitting: Physician Assistant

## 2022-02-28 ENCOUNTER — Ambulatory Visit
Admission: RE | Admit: 2022-02-28 | Discharge: 2022-02-28 | Disposition: A | Discharge status: Home / Self Care | Payer: Commercial Managed Care - PPO | Source: Ambulatory Visit | Attending: Physician Assistant | Admitting: Physician Assistant

## 2022-02-28 DIAGNOSIS — Z1231 Encounter for screening mammogram for malignant neoplasm of breast: Secondary | ICD-10-CM

## 2022-05-13 IMAGING — MG DIGITAL SCREENING BREAST BILAT IMPLANT W/ TOMO W/ CAD
8 of 14 series · 8 of 34 positions shown · non-contrast
Comparison: Previous exam(s).

CLINICAL DATA: Screening.

EXAM:
DIGITAL SCREENING BILATERAL MAMMOGRAM WITH IMPLANTS, CAD AND
TOMOSYNTHESIS
TECHNIQUE: Bilateral screening digital craniocaudal and mediolateral oblique
mammograms were obtained. Bilateral screening digital breast
tomosynthesis was performed. The images were evaluated with
computer-aided detection. Standard and/or implant displaced views
were performed.

[L CC]
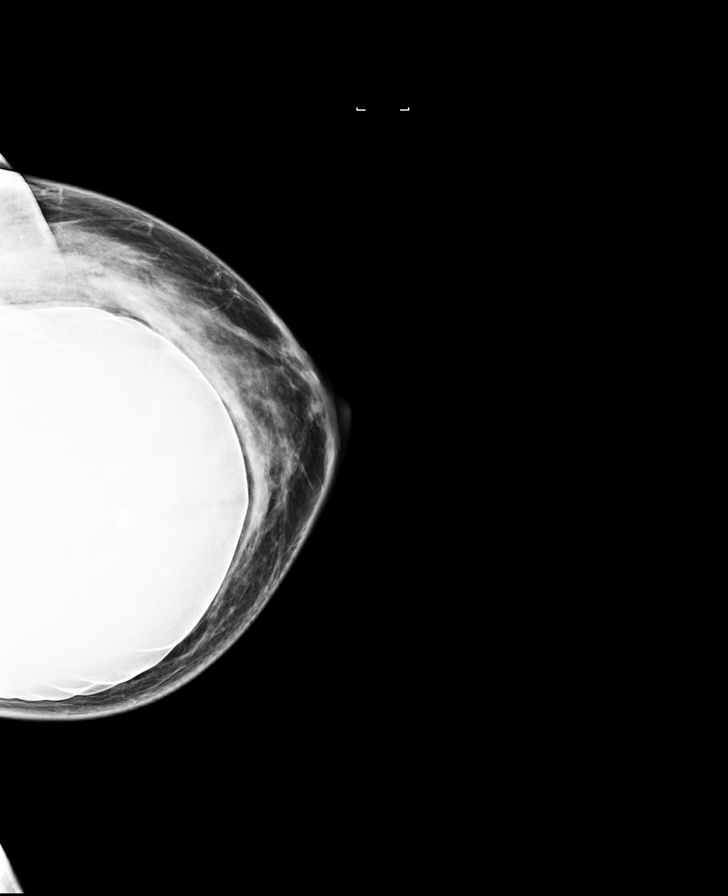

[L MLO]
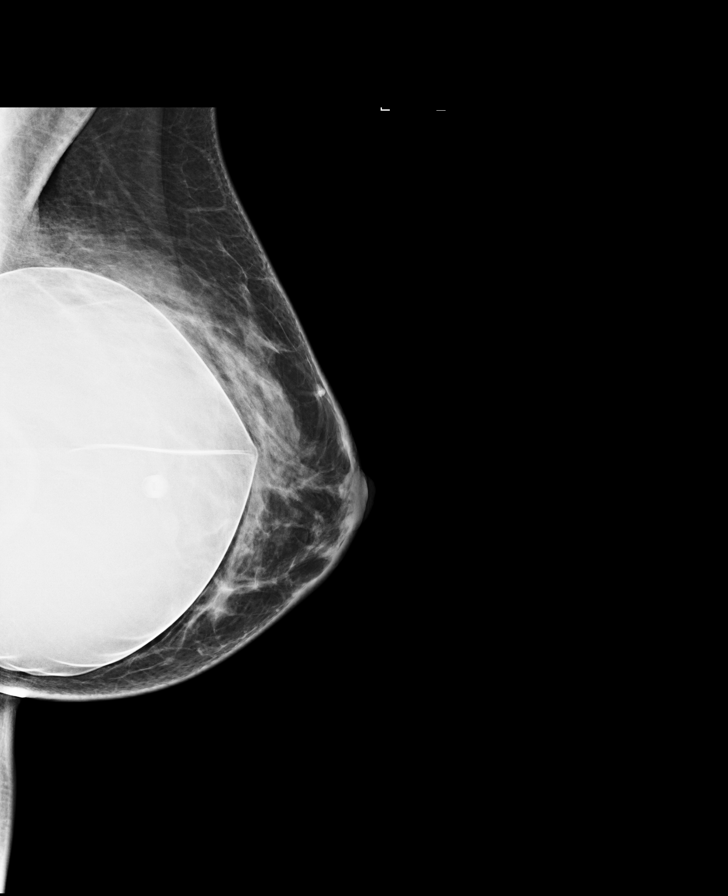

[R CC]
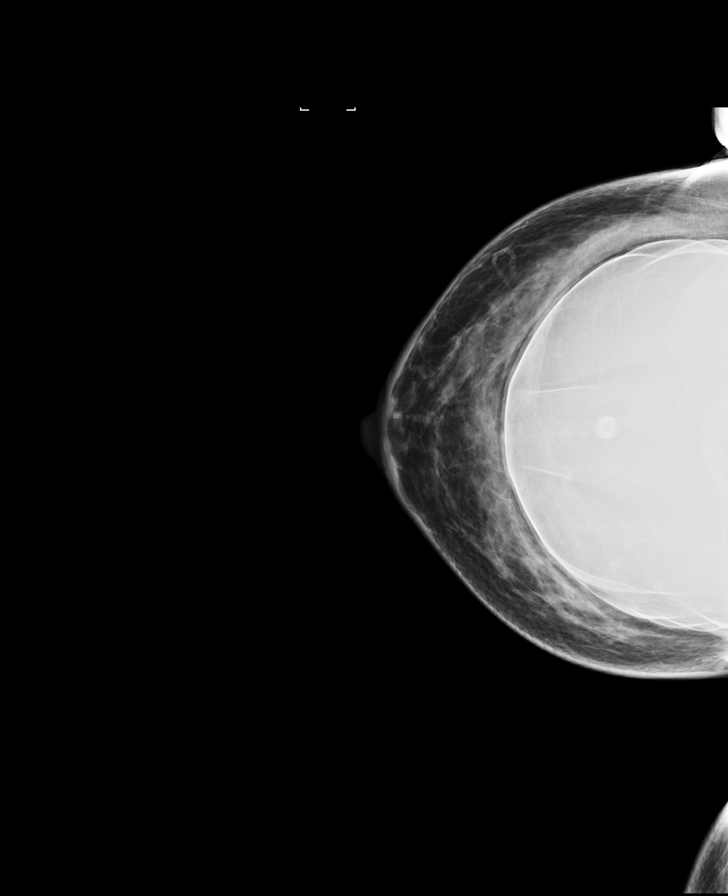

[R MLO]
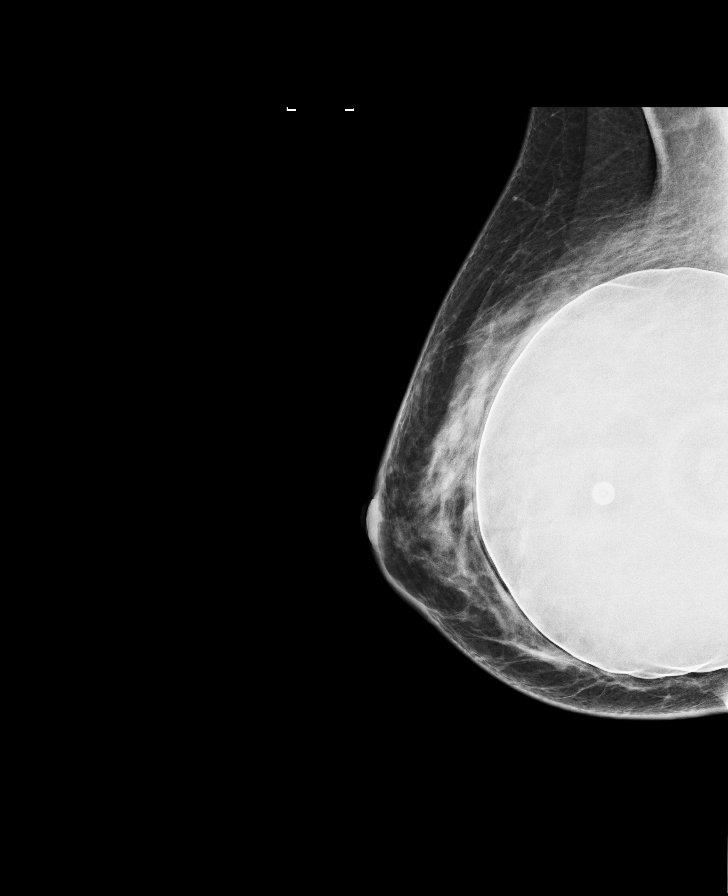

[L MLO synth-2D]
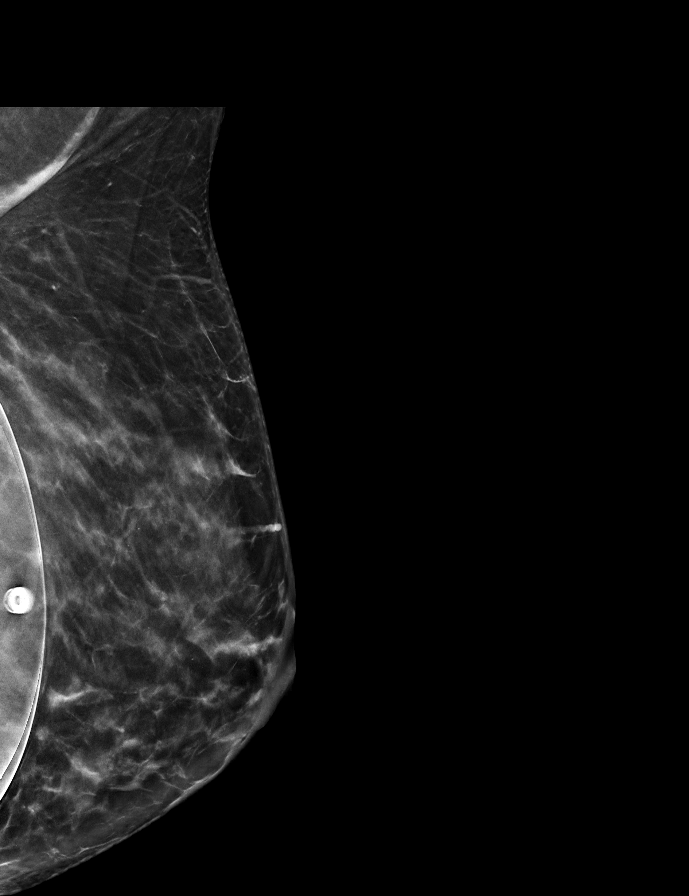

[R XCCL synth-2D]
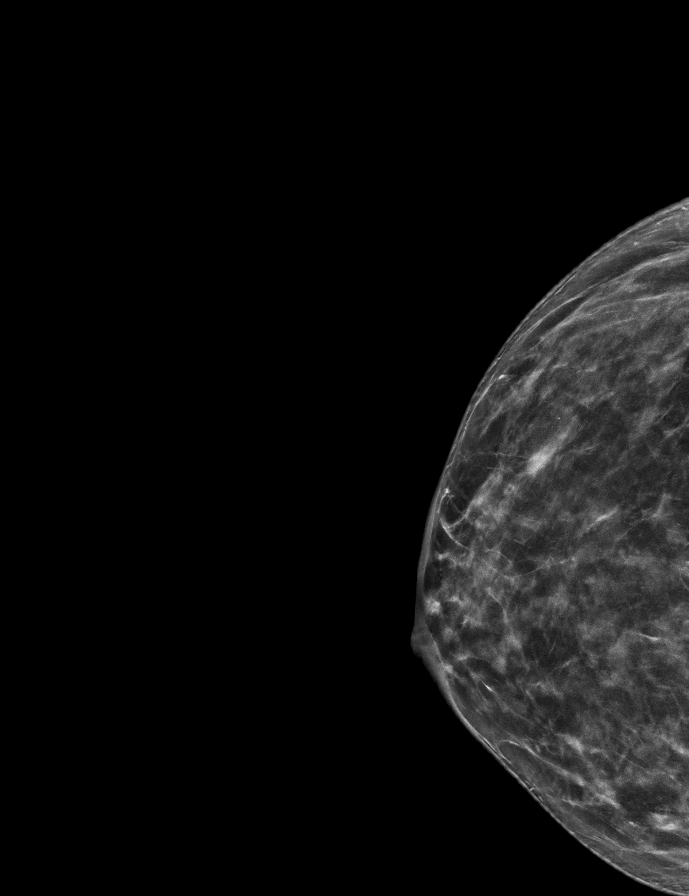

[R MLO synth-2D]
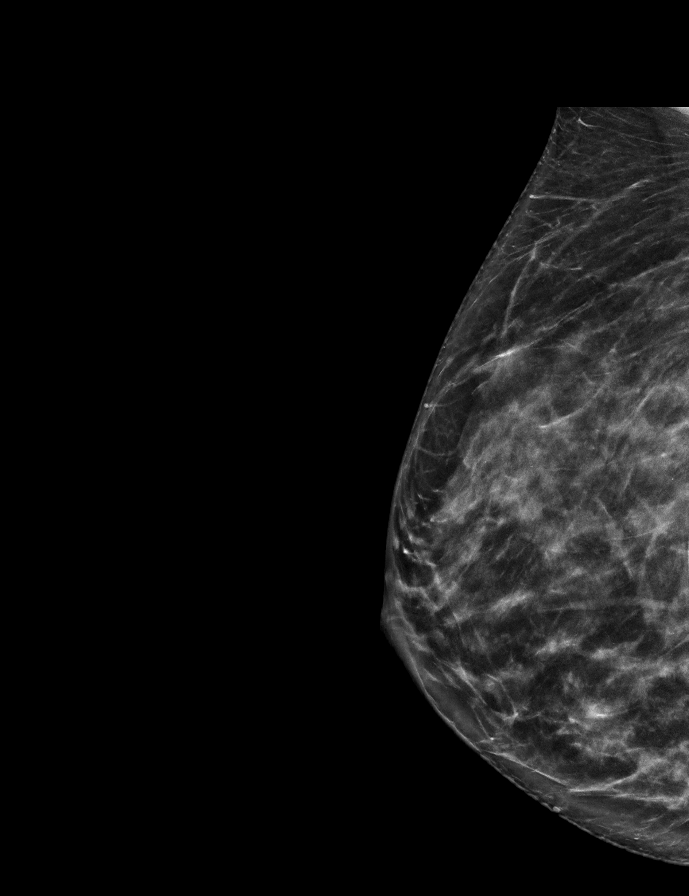

[L CC synth-2D]
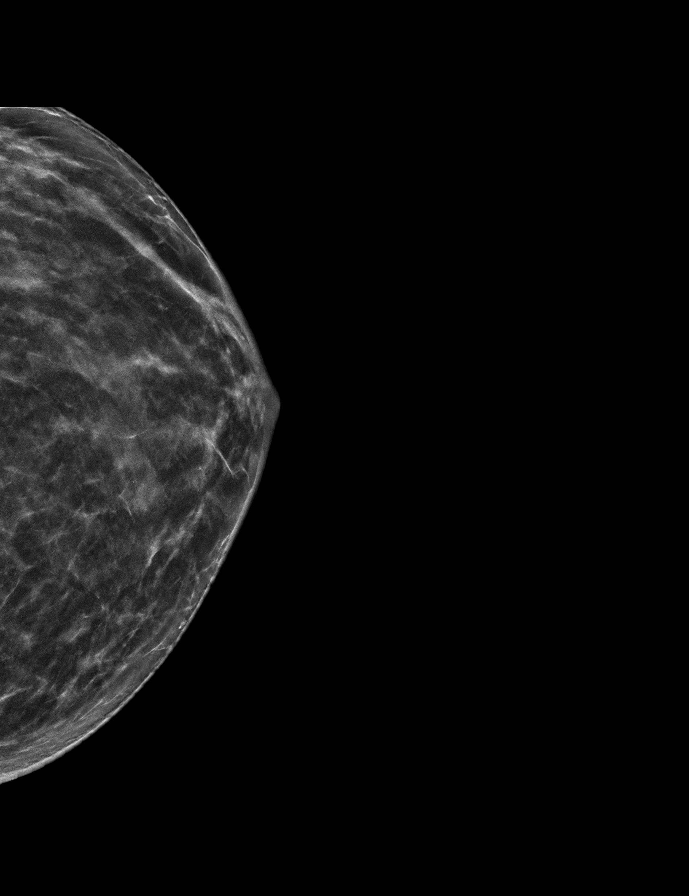

[8 of 34 positions shown; findings below may reference images not displayed]

ACR Breast Density Category c: The breast tissue is heterogeneously
dense, which may obscure small masses.
FINDINGS: The patient has retropectoral implants. There are no findings
suspicious for malignancy.
IMPRESSION: No mammographic evidence of malignancy. A result letter of this
screening mammogram will be mailed directly to the patient.

RECOMMENDATION:
Screening mammogram in one year. (Code:LT-E-7TH)

BI-RADS CATEGORY  1:  Negative.

## 2022-08-21 IMAGING — CT CT HEART MORP W/ CTA COR W/ SCORE W/ CA W/CM &/OR W/O CM
4 of 7 series · 8 of 20 positions shown, 9 images · IV contrast (omnipaque)
Comparison: None.
COMPARISON: None.

Addendum:
EXAM:
OVER-READ INTERPRETATION  CT CHEST

The following report is an over-read performed by radiologist Dr.
El Tano Bassan [[email or web address]] on 04/19/2021. This
over-read does not include interpretation of cardiac or coronary
anatomy or pathology. The coronary CTA interpretation by the
cardiologist is attached.
HISTORY: Dyspnea on exertion (EDVIN)
Cardiac/Coronary CT
TECHNIQUE: The patient was scanned on a Siemens Force scanner.
PROTOCOL: A 100 kV prospective scan was triggered in the descending thoracic
aorta at 111 HU's. Axial non-contrast 3 mm slices were carried out
through the heart. The data set was analyzed on a dedicated work
station and scored using the Agatson method. Gantry rotation speed
was 250 msecs and collimation was 0.6 mm. Heart rate optimized
medically, and 0.8 mg of sublingual nitroglycerin was given. The 3D
data set was reconstructed in 5% intervals of 35-75% of the R-R
cycle. Diastolic phases were analyzed on a dedicated work station
using MPR, MIP and VRT modes. The patient received 95mL OMNIPAQUE
IOHEXOL 350 MG/ML SOLN of contrast.

[Series 7: ts diast sharp · axial · 0.39mm/px · z∈[+1130,+1169]mm · 2 of 288 slices shown]
[im 96/288  lung]
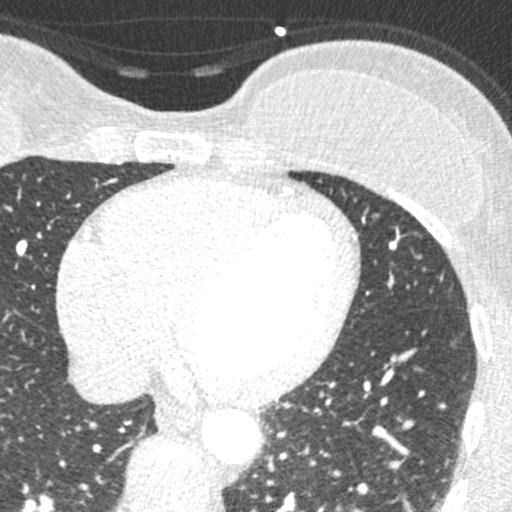
[im 192/288  lung]
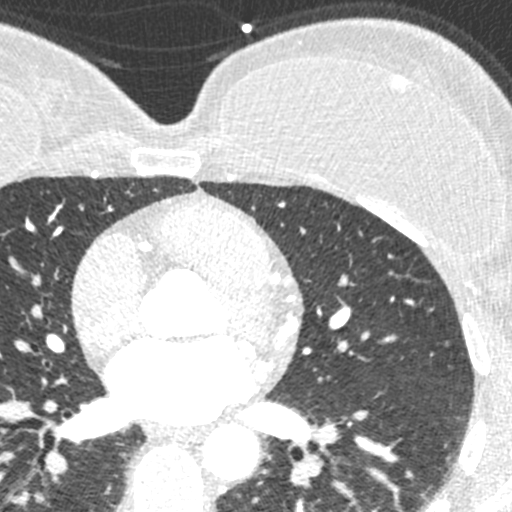

[Series 8: best diast · axial · 0.39mm/px · z∈[+1130,+1169]mm · 2 of 288 slices shown, 3 images]
[im 96/288  vessel]
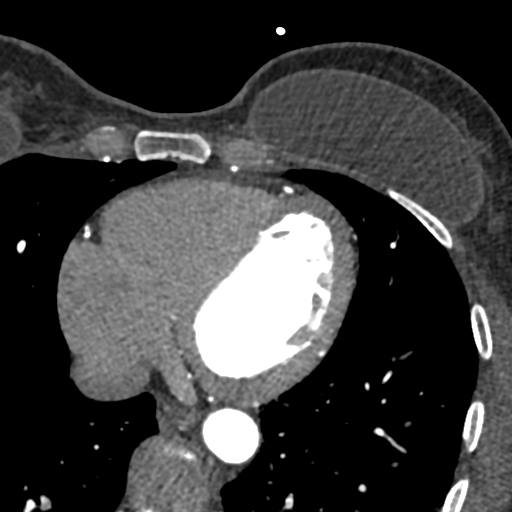
[im 96/288  lung]
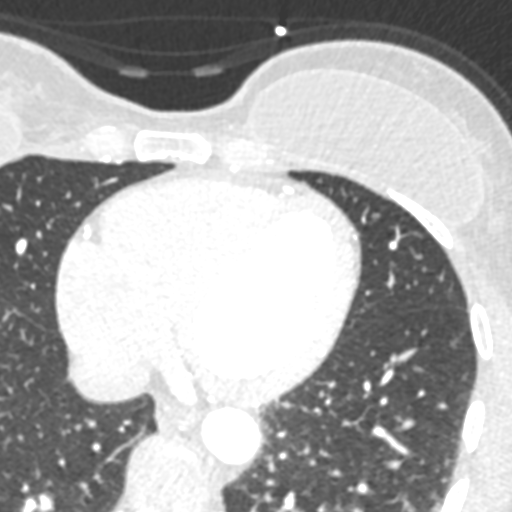
[im 192/288  vessel]
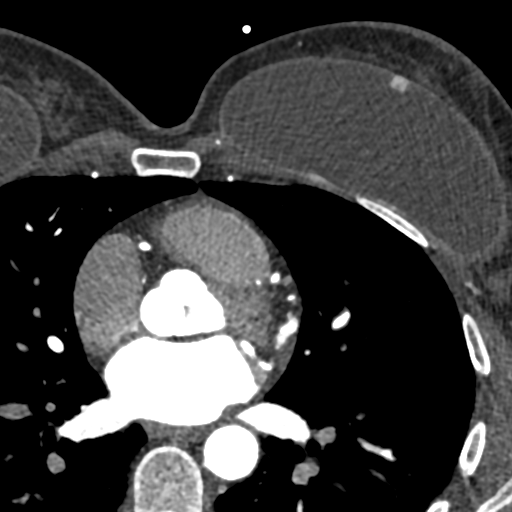

[Series 9: ts syst sharp · axial · 0.39mm/px · z∈[+1130,+1169]mm · 2 of 288 slices shown]
[im 96/288  lung]
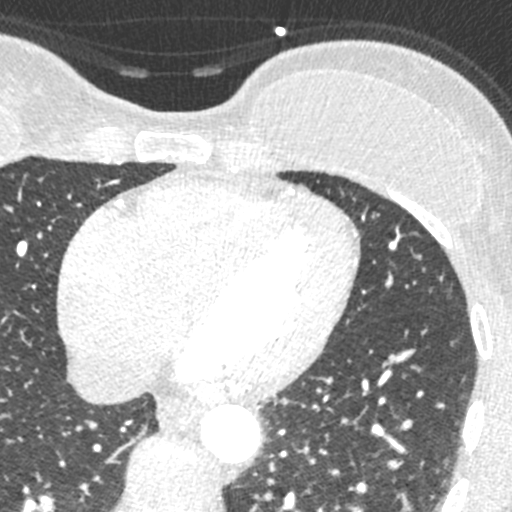
[im 192/288  lung]
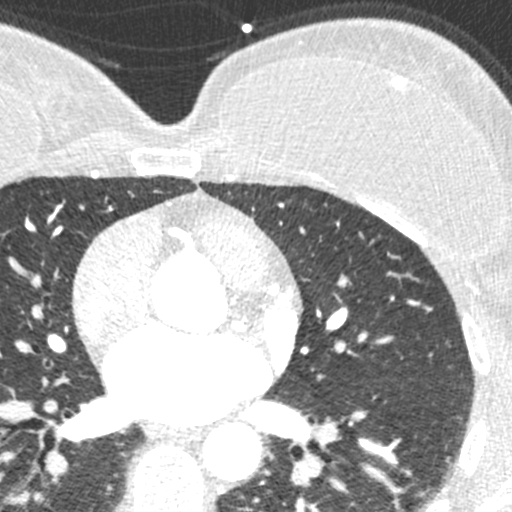

[Series 10: best syst · axial · 0.39mm/px · z∈[+1130,+1169]mm · 2 of 288 slices shown]
[im 96/288  vessel]
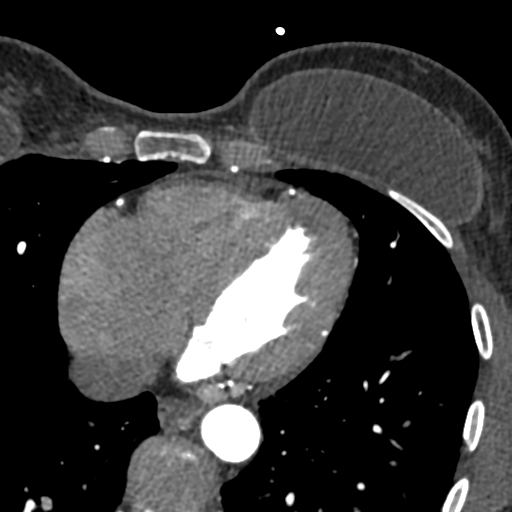
[im 192/288  vessel]
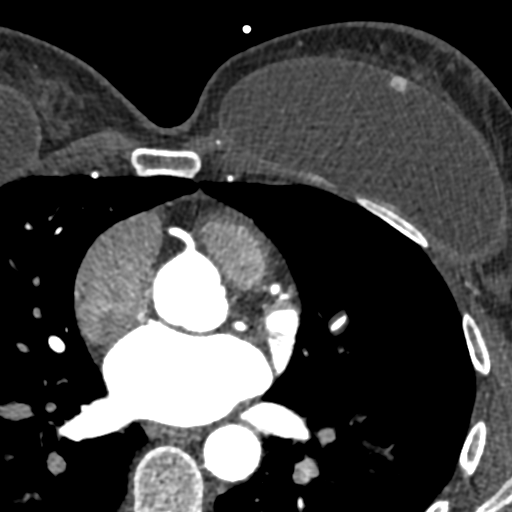

[8 of 20 positions shown; findings below may reference images not displayed]

FINDINGS: Vascular: Heart is normal size.  Aorta normal caliber.

Mediastinum/Nodes: No adenopathy

Lungs/Pleura: No confluent opacities or effusions. Minimal dependent
atelectasis.

Upper Abdomen: Imaging into the upper abdomen demonstrates no acute
findings.

Musculoskeletal: Breast implants partially visualized. Chest wall
soft tissues are unremarkable. No acute bony abnormality.
IMPRESSION: No acute or significant extracardiac abnormality.
FINDINGS: Coronary calcium score: The patient's coronary artery calcium score
is 0, which places the patient in the 0 percentile.

Coronary arteries: Normal coronary origins.  Right dominance.

Right Coronary Artery: Normal caliber vessel, gives rise to PDA. No
significant plaque or stenosis.

Left Main Coronary Artery: Normal caliber vessel. No significant
plaque or stenosis.

Left Anterior Descending Coronary Artery: Normal caliber vessel. No
significant plaque or stenosis. Gives rise to 2 diagonal branches.

Left Circumflex Artery: Normal caliber vessel. No significant plaque
or stenosis. Gives rise to 3 OM branches.

Aorta: Normal size, 26 mm at the mid ascending aorta (level of the
PA bifurcation) measured double oblique. No calcifications. No
dissection seen in visualized portions of the aorta.

Aortic Valve: No calcifications. Trileaflet.

Other findings:

Normal pulmonary vein drainage into the left atrium.

Normal left atrial appendage without a thrombus.

Normal size of the pulmonary artery.

Normal appearance of the pericardium.

Possible PFO, though no contrast shunting seen.
IMPRESSION: 1. No evidence of CAD, CADRADS = 0.

2. Coronary calcium score of 0. This was 0 percentile for age and
sex matched control.

3. Normal coronary origin with right dominance.

INTERPRETATION:

1. CAD-RADS 0: No evidence of CAD (0%). Consider non-atherosclerotic
causes of chest pain.

2. CAD-RADS 1: Minimal non-obstructive CAD (0-24%). Consider
non-atherosclerotic causes of chest pain. Consider preventive
therapy and risk factor modification.

3. CAD-RADS 2: Mild non-obstructive CAD (25-49%). Consider
non-atherosclerotic causes of chest pain. Consider preventive
therapy and risk factor modification.

4. CAD-RADS 3: Moderate stenosis (50-69%). Consider symptom-guided
anti-ischemic pharmacotherapy as well as risk factor modification
per guideline directed care. Additional analysis with CT FFR will be
submitted.

5. CAD-RADS 4: Severe stenosis. (70-99% or > 50% left main). Cardiac
catheterization or CT FFR is recommended. Consider symptom-guided
anti-ischemic pharmacotherapy as well as risk factor modification
per guideline directed care. Invasive coronary angiography
recommended with revascularization per published guideline
statements.

6. CAD-RADS 5: Total coronary occlusion (100%). Consider cardiac
catheterization or viability assessment. Consider symptom-guided
anti-ischemic pharmacotherapy as well as risk factor modification
per guideline directed care.

7. CAD-RADS N: Non-diagnostic study. Obstructive CAD can't be
excluded. Alternative evaluation is recommended.

*** End of Addendum ***
EXAM:
OVER-READ INTERPRETATION  CT CHEST

The following report is an over-read performed by radiologist Dr.
El Tano Bassan [[email or web address]] on 04/19/2021. This
over-read does not include interpretation of cardiac or coronary
anatomy or pathology. The coronary CTA interpretation by the
cardiologist is attached.
FINDINGS: Vascular: Heart is normal size.  Aorta normal caliber.

Mediastinum/Nodes: No adenopathy

Lungs/Pleura: No confluent opacities or effusions. Minimal dependent
atelectasis.

Upper Abdomen: Imaging into the upper abdomen demonstrates no acute
findings.

Musculoskeletal: Breast implants partially visualized. Chest wall
soft tissues are unremarkable. No acute bony abnormality.
IMPRESSION: No acute or significant extracardiac abnormality.

## 2023-03-06 ENCOUNTER — Other Ambulatory Visit: Payer: Self-pay | Admitting: Physician Assistant

## 2023-03-06 DIAGNOSIS — Z1231 Encounter for screening mammogram for malignant neoplasm of breast: Secondary | ICD-10-CM

## 2023-04-09 ENCOUNTER — Ambulatory Visit: Payer: BLUE CROSS/BLUE SHIELD

## 2023-06-15 ENCOUNTER — Ambulatory Visit: Payer: BLUE CROSS/BLUE SHIELD

## 2023-06-17 ENCOUNTER — Other Ambulatory Visit: Payer: Self-pay | Admitting: Physician Assistant

## 2023-06-17 DIAGNOSIS — Z1231 Encounter for screening mammogram for malignant neoplasm of breast: Secondary | ICD-10-CM

## 2023-06-24 DIAGNOSIS — Z6825 Body mass index (BMI) 25.0-25.9, adult: Secondary | ICD-10-CM | POA: Diagnosis not present

## 2023-06-24 DIAGNOSIS — Z1272 Encounter for screening for malignant neoplasm of vagina: Secondary | ICD-10-CM | POA: Diagnosis not present

## 2023-06-24 DIAGNOSIS — Z01419 Encounter for gynecological examination (general) (routine) without abnormal findings: Secondary | ICD-10-CM | POA: Diagnosis not present

## 2023-06-24 DIAGNOSIS — N951 Menopausal and female climacteric states: Secondary | ICD-10-CM | POA: Diagnosis not present

## 2023-06-25 ENCOUNTER — Ambulatory Visit
Admission: RE | Admit: 2023-06-25 | Discharge: 2023-06-25 | Disposition: A | Discharge status: Home / Self Care | Payer: 59 | Source: Ambulatory Visit

## 2023-06-25 DIAGNOSIS — Z1231 Encounter for screening mammogram for malignant neoplasm of breast: Secondary | ICD-10-CM

## 2023-06-30 ENCOUNTER — Other Ambulatory Visit: Payer: Self-pay | Admitting: Physician Assistant

## 2023-06-30 DIAGNOSIS — R928 Other abnormal and inconclusive findings on diagnostic imaging of breast: Secondary | ICD-10-CM

## 2023-07-11 ENCOUNTER — Ambulatory Visit
Admission: RE | Admit: 2023-07-11 | Discharge: 2023-07-11 | Disposition: A | Discharge status: Home / Self Care | Payer: 59 | Source: Ambulatory Visit | Attending: Physician Assistant | Admitting: Physician Assistant

## 2023-07-11 ENCOUNTER — Other Ambulatory Visit: Payer: Self-pay | Admitting: Physician Assistant

## 2023-07-11 DIAGNOSIS — R921 Mammographic calcification found on diagnostic imaging of breast: Secondary | ICD-10-CM

## 2023-07-11 DIAGNOSIS — R928 Other abnormal and inconclusive findings on diagnostic imaging of breast: Secondary | ICD-10-CM

## 2023-07-13 DIAGNOSIS — G43709 Chronic migraine without aura, not intractable, without status migrainosus: Secondary | ICD-10-CM | POA: Diagnosis not present

## 2023-07-13 DIAGNOSIS — F411 Generalized anxiety disorder: Secondary | ICD-10-CM | POA: Diagnosis not present

## 2023-07-13 DIAGNOSIS — F340 Cyclothymic disorder: Secondary | ICD-10-CM | POA: Diagnosis not present

## 2023-07-16 ENCOUNTER — Ambulatory Visit
Admission: RE | Admit: 2023-07-16 | Discharge: 2023-07-16 | Disposition: A | Discharge status: Home / Self Care | Payer: 59 | Source: Ambulatory Visit | Attending: Physician Assistant | Admitting: Physician Assistant

## 2023-07-16 DIAGNOSIS — N6321 Unspecified lump in the left breast, upper outer quadrant: Secondary | ICD-10-CM | POA: Diagnosis not present

## 2023-07-16 DIAGNOSIS — R921 Mammographic calcification found on diagnostic imaging of breast: Secondary | ICD-10-CM

## 2023-07-16 DIAGNOSIS — D242 Benign neoplasm of left breast: Secondary | ICD-10-CM | POA: Diagnosis not present

## 2023-07-16 DIAGNOSIS — N6002 Solitary cyst of left breast: Secondary | ICD-10-CM | POA: Diagnosis not present

## 2023-07-16 HISTORY — PX: BREAST BIOPSY: SHX20

## 2023-07-17 LAB — SURGICAL PATHOLOGY

## 2023-09-01 DIAGNOSIS — F411 Generalized anxiety disorder: Secondary | ICD-10-CM | POA: Diagnosis not present

## 2023-09-01 DIAGNOSIS — F331 Major depressive disorder, recurrent, moderate: Secondary | ICD-10-CM | POA: Diagnosis not present

## 2023-09-01 DIAGNOSIS — G43709 Chronic migraine without aura, not intractable, without status migrainosus: Secondary | ICD-10-CM | POA: Diagnosis not present

## 2023-09-01 DIAGNOSIS — F4011 Social phobia, generalized: Secondary | ICD-10-CM | POA: Diagnosis not present

## 2023-09-23 DIAGNOSIS — Z13820 Encounter for screening for osteoporosis: Secondary | ICD-10-CM | POA: Diagnosis not present

## 2023-09-29 DIAGNOSIS — M7542 Impingement syndrome of left shoulder: Secondary | ICD-10-CM | POA: Diagnosis not present

## 2023-10-08 DIAGNOSIS — F411 Generalized anxiety disorder: Secondary | ICD-10-CM | POA: Diagnosis not present

## 2023-10-08 DIAGNOSIS — F331 Major depressive disorder, recurrent, moderate: Secondary | ICD-10-CM | POA: Diagnosis not present

## 2023-10-08 DIAGNOSIS — F4011 Social phobia, generalized: Secondary | ICD-10-CM | POA: Diagnosis not present

## 2023-11-17 DIAGNOSIS — F340 Cyclothymic disorder: Secondary | ICD-10-CM | POA: Diagnosis not present

## 2023-11-17 DIAGNOSIS — F4011 Social phobia, generalized: Secondary | ICD-10-CM | POA: Diagnosis not present

## 2023-11-17 DIAGNOSIS — F411 Generalized anxiety disorder: Secondary | ICD-10-CM | POA: Diagnosis not present

## 2023-11-30 ENCOUNTER — Other Ambulatory Visit: Payer: Self-pay | Admitting: Physician Assistant

## 2023-11-30 DIAGNOSIS — D242 Benign neoplasm of left breast: Secondary | ICD-10-CM

## 2023-12-15 DIAGNOSIS — F418 Other specified anxiety disorders: Secondary | ICD-10-CM | POA: Diagnosis not present

## 2023-12-15 DIAGNOSIS — E7800 Pure hypercholesterolemia, unspecified: Secondary | ICD-10-CM | POA: Diagnosis not present

## 2023-12-15 DIAGNOSIS — G4700 Insomnia, unspecified: Secondary | ICD-10-CM | POA: Diagnosis not present

## 2023-12-15 DIAGNOSIS — Z79899 Other long term (current) drug therapy: Secondary | ICD-10-CM | POA: Diagnosis not present

## 2023-12-15 DIAGNOSIS — G43009 Migraine without aura, not intractable, without status migrainosus: Secondary | ICD-10-CM | POA: Diagnosis not present

## 2024-01-07 ENCOUNTER — Other Ambulatory Visit: Payer: Self-pay | Admitting: Medical Genetics

## 2024-01-13 ENCOUNTER — Other Ambulatory Visit: Payer: Self-pay | Admitting: Physician Assistant

## 2024-01-13 DIAGNOSIS — N951 Menopausal and female climacteric states: Secondary | ICD-10-CM | POA: Diagnosis not present

## 2024-01-13 DIAGNOSIS — R519 Headache, unspecified: Secondary | ICD-10-CM | POA: Diagnosis not present

## 2024-01-13 DIAGNOSIS — G43009 Migraine without aura, not intractable, without status migrainosus: Secondary | ICD-10-CM | POA: Diagnosis not present

## 2024-01-14 ENCOUNTER — Other Ambulatory Visit: Payer: Self-pay | Admitting: Physician Assistant

## 2024-01-14 ENCOUNTER — Ambulatory Visit
Admission: RE | Admit: 2024-01-14 | Discharge: 2024-01-14 | Disposition: A | Discharge status: Home / Self Care | Source: Ambulatory Visit | Attending: Physician Assistant | Admitting: Physician Assistant

## 2024-01-14 DIAGNOSIS — R928 Other abnormal and inconclusive findings on diagnostic imaging of breast: Secondary | ICD-10-CM | POA: Diagnosis not present

## 2024-01-14 DIAGNOSIS — R92322 Mammographic fibroglandular density, left breast: Secondary | ICD-10-CM | POA: Diagnosis not present

## 2024-01-14 DIAGNOSIS — D242 Benign neoplasm of left breast: Secondary | ICD-10-CM

## 2024-02-07 ENCOUNTER — Ambulatory Visit
Admission: RE | Admit: 2024-02-07 | Discharge: 2024-02-07 | Disposition: A | Discharge status: Home / Self Care | Source: Ambulatory Visit | Attending: Physician Assistant | Admitting: Physician Assistant

## 2024-02-07 DIAGNOSIS — R519 Headache, unspecified: Secondary | ICD-10-CM

## 2024-02-07 MED ORDER — GADOPICLENOL 0.5 MMOL/ML IV SOLN
6.0000 mL | Freq: Once | INTRAVENOUS | Status: AC | PRN
  Administered 2024-02-07: 6 mL via INTRAVENOUS

## 2024-02-09 ENCOUNTER — Other Ambulatory Visit

## 2024-02-15 DIAGNOSIS — G479 Sleep disorder, unspecified: Secondary | ICD-10-CM | POA: Diagnosis not present

## 2024-02-15 DIAGNOSIS — F340 Cyclothymic disorder: Secondary | ICD-10-CM | POA: Diagnosis not present

## 2024-02-15 DIAGNOSIS — F411 Generalized anxiety disorder: Secondary | ICD-10-CM | POA: Diagnosis not present

## 2024-03-01 DIAGNOSIS — F411 Generalized anxiety disorder: Secondary | ICD-10-CM | POA: Diagnosis not present

## 2024-03-01 DIAGNOSIS — F331 Major depressive disorder, recurrent, moderate: Secondary | ICD-10-CM | POA: Diagnosis not present

## 2024-03-01 DIAGNOSIS — F410 Panic disorder [episodic paroxysmal anxiety] without agoraphobia: Secondary | ICD-10-CM | POA: Diagnosis not present

## 2024-03-04 DIAGNOSIS — F418 Other specified anxiety disorders: Secondary | ICD-10-CM | POA: Diagnosis not present

## 2024-03-04 DIAGNOSIS — Z79899 Other long term (current) drug therapy: Secondary | ICD-10-CM | POA: Diagnosis not present

## 2024-03-04 DIAGNOSIS — Z23 Encounter for immunization: Secondary | ICD-10-CM | POA: Diagnosis not present

## 2024-03-04 DIAGNOSIS — W57XXXA Bitten or stung by nonvenomous insect and other nonvenomous arthropods, initial encounter: Secondary | ICD-10-CM | POA: Diagnosis not present

## 2024-03-04 DIAGNOSIS — M2550 Pain in unspecified joint: Secondary | ICD-10-CM | POA: Diagnosis not present

## 2024-03-04 DIAGNOSIS — G43009 Migraine without aura, not intractable, without status migrainosus: Secondary | ICD-10-CM | POA: Diagnosis not present

## 2024-03-04 DIAGNOSIS — G629 Polyneuropathy, unspecified: Secondary | ICD-10-CM | POA: Diagnosis not present

## 2024-03-24 DIAGNOSIS — F340 Cyclothymic disorder: Secondary | ICD-10-CM | POA: Diagnosis not present

## 2024-03-24 DIAGNOSIS — F410 Panic disorder [episodic paroxysmal anxiety] without agoraphobia: Secondary | ICD-10-CM | POA: Diagnosis not present

## 2024-03-24 DIAGNOSIS — F4011 Social phobia, generalized: Secondary | ICD-10-CM | POA: Diagnosis not present

## 2024-03-24 DIAGNOSIS — F411 Generalized anxiety disorder: Secondary | ICD-10-CM | POA: Diagnosis not present

## 2024-03-25 DIAGNOSIS — M9904 Segmental and somatic dysfunction of sacral region: Secondary | ICD-10-CM | POA: Diagnosis not present

## 2024-03-25 DIAGNOSIS — M9902 Segmental and somatic dysfunction of thoracic region: Secondary | ICD-10-CM | POA: Diagnosis not present

## 2024-03-25 DIAGNOSIS — R293 Abnormal posture: Secondary | ICD-10-CM | POA: Diagnosis not present

## 2024-03-25 DIAGNOSIS — M47816 Spondylosis without myelopathy or radiculopathy, lumbar region: Secondary | ICD-10-CM | POA: Diagnosis not present

## 2024-03-25 DIAGNOSIS — M9905 Segmental and somatic dysfunction of pelvic region: Secondary | ICD-10-CM | POA: Diagnosis not present

## 2024-03-25 DIAGNOSIS — M4723 Other spondylosis with radiculopathy, cervicothoracic region: Secondary | ICD-10-CM | POA: Diagnosis not present

## 2024-03-25 DIAGNOSIS — M9901 Segmental and somatic dysfunction of cervical region: Secondary | ICD-10-CM | POA: Diagnosis not present

## 2024-03-25 DIAGNOSIS — M9903 Segmental and somatic dysfunction of lumbar region: Secondary | ICD-10-CM | POA: Diagnosis not present

## 2024-03-29 DIAGNOSIS — M9904 Segmental and somatic dysfunction of sacral region: Secondary | ICD-10-CM | POA: Diagnosis not present

## 2024-03-29 DIAGNOSIS — M47816 Spondylosis without myelopathy or radiculopathy, lumbar region: Secondary | ICD-10-CM | POA: Diagnosis not present

## 2024-03-29 DIAGNOSIS — M9902 Segmental and somatic dysfunction of thoracic region: Secondary | ICD-10-CM | POA: Diagnosis not present

## 2024-03-29 DIAGNOSIS — R293 Abnormal posture: Secondary | ICD-10-CM | POA: Diagnosis not present

## 2024-03-29 DIAGNOSIS — M9901 Segmental and somatic dysfunction of cervical region: Secondary | ICD-10-CM | POA: Diagnosis not present

## 2024-03-29 DIAGNOSIS — M4723 Other spondylosis with radiculopathy, cervicothoracic region: Secondary | ICD-10-CM | POA: Diagnosis not present

## 2024-03-29 DIAGNOSIS — M9905 Segmental and somatic dysfunction of pelvic region: Secondary | ICD-10-CM | POA: Diagnosis not present

## 2024-03-29 DIAGNOSIS — M9903 Segmental and somatic dysfunction of lumbar region: Secondary | ICD-10-CM | POA: Diagnosis not present

## 2024-04-01 DIAGNOSIS — M9902 Segmental and somatic dysfunction of thoracic region: Secondary | ICD-10-CM | POA: Diagnosis not present

## 2024-04-01 DIAGNOSIS — R293 Abnormal posture: Secondary | ICD-10-CM | POA: Diagnosis not present

## 2024-04-01 DIAGNOSIS — M47816 Spondylosis without myelopathy or radiculopathy, lumbar region: Secondary | ICD-10-CM | POA: Diagnosis not present

## 2024-04-01 DIAGNOSIS — M4723 Other spondylosis with radiculopathy, cervicothoracic region: Secondary | ICD-10-CM | POA: Diagnosis not present

## 2024-04-01 DIAGNOSIS — M9903 Segmental and somatic dysfunction of lumbar region: Secondary | ICD-10-CM | POA: Diagnosis not present

## 2024-04-01 DIAGNOSIS — M9904 Segmental and somatic dysfunction of sacral region: Secondary | ICD-10-CM | POA: Diagnosis not present

## 2024-04-01 DIAGNOSIS — M9901 Segmental and somatic dysfunction of cervical region: Secondary | ICD-10-CM | POA: Diagnosis not present

## 2024-04-01 DIAGNOSIS — M9905 Segmental and somatic dysfunction of pelvic region: Secondary | ICD-10-CM | POA: Diagnosis not present

## 2024-04-04 DIAGNOSIS — M9902 Segmental and somatic dysfunction of thoracic region: Secondary | ICD-10-CM | POA: Diagnosis not present

## 2024-04-04 DIAGNOSIS — M47816 Spondylosis without myelopathy or radiculopathy, lumbar region: Secondary | ICD-10-CM | POA: Diagnosis not present

## 2024-04-04 DIAGNOSIS — M9904 Segmental and somatic dysfunction of sacral region: Secondary | ICD-10-CM | POA: Diagnosis not present

## 2024-04-04 DIAGNOSIS — M9903 Segmental and somatic dysfunction of lumbar region: Secondary | ICD-10-CM | POA: Diagnosis not present

## 2024-04-04 DIAGNOSIS — M4723 Other spondylosis with radiculopathy, cervicothoracic region: Secondary | ICD-10-CM | POA: Diagnosis not present

## 2024-04-04 DIAGNOSIS — M9905 Segmental and somatic dysfunction of pelvic region: Secondary | ICD-10-CM | POA: Diagnosis not present

## 2024-04-04 DIAGNOSIS — M9901 Segmental and somatic dysfunction of cervical region: Secondary | ICD-10-CM | POA: Diagnosis not present

## 2024-04-04 DIAGNOSIS — R293 Abnormal posture: Secondary | ICD-10-CM | POA: Diagnosis not present

## 2024-04-07 ENCOUNTER — Other Ambulatory Visit: Payer: Self-pay | Admitting: Medical Genetics

## 2024-04-07 DIAGNOSIS — Z006 Encounter for examination for normal comparison and control in clinical research program: Secondary | ICD-10-CM

## 2024-04-08 DIAGNOSIS — M47816 Spondylosis without myelopathy or radiculopathy, lumbar region: Secondary | ICD-10-CM | POA: Diagnosis not present

## 2024-04-08 DIAGNOSIS — M9904 Segmental and somatic dysfunction of sacral region: Secondary | ICD-10-CM | POA: Diagnosis not present

## 2024-04-08 DIAGNOSIS — M9905 Segmental and somatic dysfunction of pelvic region: Secondary | ICD-10-CM | POA: Diagnosis not present

## 2024-04-08 DIAGNOSIS — R293 Abnormal posture: Secondary | ICD-10-CM | POA: Diagnosis not present

## 2024-04-08 DIAGNOSIS — M9903 Segmental and somatic dysfunction of lumbar region: Secondary | ICD-10-CM | POA: Diagnosis not present

## 2024-04-08 DIAGNOSIS — M9901 Segmental and somatic dysfunction of cervical region: Secondary | ICD-10-CM | POA: Diagnosis not present

## 2024-04-08 DIAGNOSIS — M4723 Other spondylosis with radiculopathy, cervicothoracic region: Secondary | ICD-10-CM | POA: Diagnosis not present

## 2024-04-08 DIAGNOSIS — M9902 Segmental and somatic dysfunction of thoracic region: Secondary | ICD-10-CM | POA: Diagnosis not present

## 2024-04-11 DIAGNOSIS — M9902 Segmental and somatic dysfunction of thoracic region: Secondary | ICD-10-CM | POA: Diagnosis not present

## 2024-04-11 DIAGNOSIS — M47816 Spondylosis without myelopathy or radiculopathy, lumbar region: Secondary | ICD-10-CM | POA: Diagnosis not present

## 2024-04-11 DIAGNOSIS — M9904 Segmental and somatic dysfunction of sacral region: Secondary | ICD-10-CM | POA: Diagnosis not present

## 2024-04-11 DIAGNOSIS — M9903 Segmental and somatic dysfunction of lumbar region: Secondary | ICD-10-CM | POA: Diagnosis not present

## 2024-04-11 DIAGNOSIS — M9901 Segmental and somatic dysfunction of cervical region: Secondary | ICD-10-CM | POA: Diagnosis not present

## 2024-04-11 DIAGNOSIS — M4723 Other spondylosis with radiculopathy, cervicothoracic region: Secondary | ICD-10-CM | POA: Diagnosis not present

## 2024-04-11 DIAGNOSIS — R293 Abnormal posture: Secondary | ICD-10-CM | POA: Diagnosis not present

## 2024-04-11 DIAGNOSIS — M9905 Segmental and somatic dysfunction of pelvic region: Secondary | ICD-10-CM | POA: Diagnosis not present

## 2024-04-12 DIAGNOSIS — M7918 Myalgia, other site: Secondary | ICD-10-CM | POA: Diagnosis not present

## 2024-04-12 DIAGNOSIS — Q7962 Hypermobile Ehlers-Danlos syndrome: Secondary | ICD-10-CM | POA: Diagnosis not present

## 2024-04-12 DIAGNOSIS — F418 Other specified anxiety disorders: Secondary | ICD-10-CM | POA: Diagnosis not present

## 2024-04-12 DIAGNOSIS — M25812 Other specified joint disorders, left shoulder: Secondary | ICD-10-CM | POA: Diagnosis not present

## 2024-04-15 DIAGNOSIS — M9901 Segmental and somatic dysfunction of cervical region: Secondary | ICD-10-CM | POA: Diagnosis not present

## 2024-04-15 DIAGNOSIS — M4723 Other spondylosis with radiculopathy, cervicothoracic region: Secondary | ICD-10-CM | POA: Diagnosis not present

## 2024-04-15 DIAGNOSIS — M9904 Segmental and somatic dysfunction of sacral region: Secondary | ICD-10-CM | POA: Diagnosis not present

## 2024-04-15 DIAGNOSIS — M47816 Spondylosis without myelopathy or radiculopathy, lumbar region: Secondary | ICD-10-CM | POA: Diagnosis not present

## 2024-04-15 DIAGNOSIS — R293 Abnormal posture: Secondary | ICD-10-CM | POA: Diagnosis not present

## 2024-04-15 DIAGNOSIS — M9903 Segmental and somatic dysfunction of lumbar region: Secondary | ICD-10-CM | POA: Diagnosis not present

## 2024-04-15 DIAGNOSIS — M9905 Segmental and somatic dysfunction of pelvic region: Secondary | ICD-10-CM | POA: Diagnosis not present

## 2024-04-15 DIAGNOSIS — M9902 Segmental and somatic dysfunction of thoracic region: Secondary | ICD-10-CM | POA: Diagnosis not present

## 2024-04-18 DIAGNOSIS — M9905 Segmental and somatic dysfunction of pelvic region: Secondary | ICD-10-CM | POA: Diagnosis not present

## 2024-04-18 DIAGNOSIS — M47816 Spondylosis without myelopathy or radiculopathy, lumbar region: Secondary | ICD-10-CM | POA: Diagnosis not present

## 2024-04-18 DIAGNOSIS — M9904 Segmental and somatic dysfunction of sacral region: Secondary | ICD-10-CM | POA: Diagnosis not present

## 2024-04-18 DIAGNOSIS — R293 Abnormal posture: Secondary | ICD-10-CM | POA: Diagnosis not present

## 2024-04-18 DIAGNOSIS — M9903 Segmental and somatic dysfunction of lumbar region: Secondary | ICD-10-CM | POA: Diagnosis not present

## 2024-04-18 DIAGNOSIS — M4723 Other spondylosis with radiculopathy, cervicothoracic region: Secondary | ICD-10-CM | POA: Diagnosis not present

## 2024-04-18 DIAGNOSIS — M9902 Segmental and somatic dysfunction of thoracic region: Secondary | ICD-10-CM | POA: Diagnosis not present

## 2024-04-18 DIAGNOSIS — M9901 Segmental and somatic dysfunction of cervical region: Secondary | ICD-10-CM | POA: Diagnosis not present

## 2024-04-21 DIAGNOSIS — F410 Panic disorder [episodic paroxysmal anxiety] without agoraphobia: Secondary | ICD-10-CM | POA: Diagnosis not present

## 2024-04-21 DIAGNOSIS — F411 Generalized anxiety disorder: Secondary | ICD-10-CM | POA: Diagnosis not present

## 2024-04-21 DIAGNOSIS — F340 Cyclothymic disorder: Secondary | ICD-10-CM | POA: Diagnosis not present

## 2024-04-22 DIAGNOSIS — M9903 Segmental and somatic dysfunction of lumbar region: Secondary | ICD-10-CM | POA: Diagnosis not present

## 2024-04-22 DIAGNOSIS — M9901 Segmental and somatic dysfunction of cervical region: Secondary | ICD-10-CM | POA: Diagnosis not present

## 2024-04-22 DIAGNOSIS — M9905 Segmental and somatic dysfunction of pelvic region: Secondary | ICD-10-CM | POA: Diagnosis not present

## 2024-04-22 DIAGNOSIS — R293 Abnormal posture: Secondary | ICD-10-CM | POA: Diagnosis not present

## 2024-04-22 DIAGNOSIS — M9902 Segmental and somatic dysfunction of thoracic region: Secondary | ICD-10-CM | POA: Diagnosis not present

## 2024-04-22 DIAGNOSIS — M4723 Other spondylosis with radiculopathy, cervicothoracic region: Secondary | ICD-10-CM | POA: Diagnosis not present

## 2024-04-22 DIAGNOSIS — M47816 Spondylosis without myelopathy or radiculopathy, lumbar region: Secondary | ICD-10-CM | POA: Diagnosis not present

## 2024-04-22 DIAGNOSIS — M9904 Segmental and somatic dysfunction of sacral region: Secondary | ICD-10-CM | POA: Diagnosis not present

## 2024-04-25 DIAGNOSIS — M9901 Segmental and somatic dysfunction of cervical region: Secondary | ICD-10-CM | POA: Diagnosis not present

## 2024-04-25 DIAGNOSIS — M4723 Other spondylosis with radiculopathy, cervicothoracic region: Secondary | ICD-10-CM | POA: Diagnosis not present

## 2024-04-25 DIAGNOSIS — M9904 Segmental and somatic dysfunction of sacral region: Secondary | ICD-10-CM | POA: Diagnosis not present

## 2024-04-25 DIAGNOSIS — M9903 Segmental and somatic dysfunction of lumbar region: Secondary | ICD-10-CM | POA: Diagnosis not present

## 2024-04-25 DIAGNOSIS — M9905 Segmental and somatic dysfunction of pelvic region: Secondary | ICD-10-CM | POA: Diagnosis not present

## 2024-04-25 DIAGNOSIS — R293 Abnormal posture: Secondary | ICD-10-CM | POA: Diagnosis not present

## 2024-04-25 DIAGNOSIS — M47816 Spondylosis without myelopathy or radiculopathy, lumbar region: Secondary | ICD-10-CM | POA: Diagnosis not present

## 2024-04-25 DIAGNOSIS — M9902 Segmental and somatic dysfunction of thoracic region: Secondary | ICD-10-CM | POA: Diagnosis not present

## 2024-04-29 DIAGNOSIS — M4723 Other spondylosis with radiculopathy, cervicothoracic region: Secondary | ICD-10-CM | POA: Diagnosis not present

## 2024-04-29 DIAGNOSIS — M9903 Segmental and somatic dysfunction of lumbar region: Secondary | ICD-10-CM | POA: Diagnosis not present

## 2024-04-29 DIAGNOSIS — M9905 Segmental and somatic dysfunction of pelvic region: Secondary | ICD-10-CM | POA: Diagnosis not present

## 2024-04-29 DIAGNOSIS — M9901 Segmental and somatic dysfunction of cervical region: Secondary | ICD-10-CM | POA: Diagnosis not present

## 2024-04-29 DIAGNOSIS — M47816 Spondylosis without myelopathy or radiculopathy, lumbar region: Secondary | ICD-10-CM | POA: Diagnosis not present

## 2024-04-29 DIAGNOSIS — R293 Abnormal posture: Secondary | ICD-10-CM | POA: Diagnosis not present

## 2024-04-29 DIAGNOSIS — M9904 Segmental and somatic dysfunction of sacral region: Secondary | ICD-10-CM | POA: Diagnosis not present

## 2024-04-29 DIAGNOSIS — M9902 Segmental and somatic dysfunction of thoracic region: Secondary | ICD-10-CM | POA: Diagnosis not present

## 2024-05-02 DIAGNOSIS — M9902 Segmental and somatic dysfunction of thoracic region: Secondary | ICD-10-CM | POA: Diagnosis not present

## 2024-05-02 DIAGNOSIS — R293 Abnormal posture: Secondary | ICD-10-CM | POA: Diagnosis not present

## 2024-05-02 DIAGNOSIS — M9904 Segmental and somatic dysfunction of sacral region: Secondary | ICD-10-CM | POA: Diagnosis not present

## 2024-05-02 DIAGNOSIS — M9903 Segmental and somatic dysfunction of lumbar region: Secondary | ICD-10-CM | POA: Diagnosis not present

## 2024-05-02 DIAGNOSIS — M4723 Other spondylosis with radiculopathy, cervicothoracic region: Secondary | ICD-10-CM | POA: Diagnosis not present

## 2024-05-02 DIAGNOSIS — M47816 Spondylosis without myelopathy or radiculopathy, lumbar region: Secondary | ICD-10-CM | POA: Diagnosis not present

## 2024-05-02 DIAGNOSIS — M9901 Segmental and somatic dysfunction of cervical region: Secondary | ICD-10-CM | POA: Diagnosis not present

## 2024-05-02 DIAGNOSIS — M9905 Segmental and somatic dysfunction of pelvic region: Secondary | ICD-10-CM | POA: Diagnosis not present

## 2024-05-02 LAB — GENECONNECT MOLECULAR SCREEN: Genetic Analysis Overall Interpretation: NEGATIVE

## 2024-05-09 DIAGNOSIS — M9901 Segmental and somatic dysfunction of cervical region: Secondary | ICD-10-CM | POA: Diagnosis not present

## 2024-05-09 DIAGNOSIS — M9905 Segmental and somatic dysfunction of pelvic region: Secondary | ICD-10-CM | POA: Diagnosis not present

## 2024-05-09 DIAGNOSIS — M47816 Spondylosis without myelopathy or radiculopathy, lumbar region: Secondary | ICD-10-CM | POA: Diagnosis not present

## 2024-05-09 DIAGNOSIS — M9904 Segmental and somatic dysfunction of sacral region: Secondary | ICD-10-CM | POA: Diagnosis not present

## 2024-05-09 DIAGNOSIS — R293 Abnormal posture: Secondary | ICD-10-CM | POA: Diagnosis not present

## 2024-05-09 DIAGNOSIS — M9903 Segmental and somatic dysfunction of lumbar region: Secondary | ICD-10-CM | POA: Diagnosis not present

## 2024-05-09 DIAGNOSIS — M9902 Segmental and somatic dysfunction of thoracic region: Secondary | ICD-10-CM | POA: Diagnosis not present

## 2024-05-09 DIAGNOSIS — M4723 Other spondylosis with radiculopathy, cervicothoracic region: Secondary | ICD-10-CM | POA: Diagnosis not present

## 2024-05-16 DIAGNOSIS — M47816 Spondylosis without myelopathy or radiculopathy, lumbar region: Secondary | ICD-10-CM | POA: Diagnosis not present

## 2024-05-16 DIAGNOSIS — M4723 Other spondylosis with radiculopathy, cervicothoracic region: Secondary | ICD-10-CM | POA: Diagnosis not present

## 2024-05-16 DIAGNOSIS — M9902 Segmental and somatic dysfunction of thoracic region: Secondary | ICD-10-CM | POA: Diagnosis not present

## 2024-05-16 DIAGNOSIS — M9901 Segmental and somatic dysfunction of cervical region: Secondary | ICD-10-CM | POA: Diagnosis not present

## 2024-05-16 DIAGNOSIS — M9904 Segmental and somatic dysfunction of sacral region: Secondary | ICD-10-CM | POA: Diagnosis not present

## 2024-05-16 DIAGNOSIS — R293 Abnormal posture: Secondary | ICD-10-CM | POA: Diagnosis not present

## 2024-05-16 DIAGNOSIS — M9905 Segmental and somatic dysfunction of pelvic region: Secondary | ICD-10-CM | POA: Diagnosis not present

## 2024-05-16 DIAGNOSIS — M9903 Segmental and somatic dysfunction of lumbar region: Secondary | ICD-10-CM | POA: Diagnosis not present

## 2024-05-18 DIAGNOSIS — M9901 Segmental and somatic dysfunction of cervical region: Secondary | ICD-10-CM | POA: Diagnosis not present

## 2024-05-18 DIAGNOSIS — M9903 Segmental and somatic dysfunction of lumbar region: Secondary | ICD-10-CM | POA: Diagnosis not present

## 2024-05-18 DIAGNOSIS — M47816 Spondylosis without myelopathy or radiculopathy, lumbar region: Secondary | ICD-10-CM | POA: Diagnosis not present

## 2024-05-18 DIAGNOSIS — M9904 Segmental and somatic dysfunction of sacral region: Secondary | ICD-10-CM | POA: Diagnosis not present

## 2024-05-18 DIAGNOSIS — M9905 Segmental and somatic dysfunction of pelvic region: Secondary | ICD-10-CM | POA: Diagnosis not present

## 2024-05-18 DIAGNOSIS — R293 Abnormal posture: Secondary | ICD-10-CM | POA: Diagnosis not present

## 2024-05-18 DIAGNOSIS — M9902 Segmental and somatic dysfunction of thoracic region: Secondary | ICD-10-CM | POA: Diagnosis not present

## 2024-05-18 DIAGNOSIS — M4723 Other spondylosis with radiculopathy, cervicothoracic region: Secondary | ICD-10-CM | POA: Diagnosis not present

## 2024-05-20 DIAGNOSIS — M9902 Segmental and somatic dysfunction of thoracic region: Secondary | ICD-10-CM | POA: Diagnosis not present

## 2024-05-20 DIAGNOSIS — R293 Abnormal posture: Secondary | ICD-10-CM | POA: Diagnosis not present

## 2024-05-20 DIAGNOSIS — M4723 Other spondylosis with radiculopathy, cervicothoracic region: Secondary | ICD-10-CM | POA: Diagnosis not present

## 2024-05-20 DIAGNOSIS — M9901 Segmental and somatic dysfunction of cervical region: Secondary | ICD-10-CM | POA: Diagnosis not present

## 2024-05-20 DIAGNOSIS — M9905 Segmental and somatic dysfunction of pelvic region: Secondary | ICD-10-CM | POA: Diagnosis not present

## 2024-05-20 DIAGNOSIS — M9904 Segmental and somatic dysfunction of sacral region: Secondary | ICD-10-CM | POA: Diagnosis not present

## 2024-05-20 DIAGNOSIS — M9903 Segmental and somatic dysfunction of lumbar region: Secondary | ICD-10-CM | POA: Diagnosis not present

## 2024-05-20 DIAGNOSIS — M47816 Spondylosis without myelopathy or radiculopathy, lumbar region: Secondary | ICD-10-CM | POA: Diagnosis not present

## 2024-05-25 DIAGNOSIS — R293 Abnormal posture: Secondary | ICD-10-CM | POA: Diagnosis not present

## 2024-05-25 DIAGNOSIS — M4723 Other spondylosis with radiculopathy, cervicothoracic region: Secondary | ICD-10-CM | POA: Diagnosis not present

## 2024-05-25 DIAGNOSIS — M9902 Segmental and somatic dysfunction of thoracic region: Secondary | ICD-10-CM | POA: Diagnosis not present

## 2024-05-25 DIAGNOSIS — M9903 Segmental and somatic dysfunction of lumbar region: Secondary | ICD-10-CM | POA: Diagnosis not present

## 2024-05-25 DIAGNOSIS — M9904 Segmental and somatic dysfunction of sacral region: Secondary | ICD-10-CM | POA: Diagnosis not present

## 2024-05-25 DIAGNOSIS — M9901 Segmental and somatic dysfunction of cervical region: Secondary | ICD-10-CM | POA: Diagnosis not present

## 2024-05-25 DIAGNOSIS — M47816 Spondylosis without myelopathy or radiculopathy, lumbar region: Secondary | ICD-10-CM | POA: Diagnosis not present

## 2024-05-25 DIAGNOSIS — M9905 Segmental and somatic dysfunction of pelvic region: Secondary | ICD-10-CM | POA: Diagnosis not present

## 2024-05-27 DIAGNOSIS — M9905 Segmental and somatic dysfunction of pelvic region: Secondary | ICD-10-CM | POA: Diagnosis not present

## 2024-05-27 DIAGNOSIS — M9902 Segmental and somatic dysfunction of thoracic region: Secondary | ICD-10-CM | POA: Diagnosis not present

## 2024-05-27 DIAGNOSIS — R293 Abnormal posture: Secondary | ICD-10-CM | POA: Diagnosis not present

## 2024-05-27 DIAGNOSIS — M9903 Segmental and somatic dysfunction of lumbar region: Secondary | ICD-10-CM | POA: Diagnosis not present

## 2024-05-27 DIAGNOSIS — M9901 Segmental and somatic dysfunction of cervical region: Secondary | ICD-10-CM | POA: Diagnosis not present

## 2024-05-27 DIAGNOSIS — M47816 Spondylosis without myelopathy or radiculopathy, lumbar region: Secondary | ICD-10-CM | POA: Diagnosis not present

## 2024-05-27 DIAGNOSIS — M4723 Other spondylosis with radiculopathy, cervicothoracic region: Secondary | ICD-10-CM | POA: Diagnosis not present

## 2024-05-27 DIAGNOSIS — M9904 Segmental and somatic dysfunction of sacral region: Secondary | ICD-10-CM | POA: Diagnosis not present

## 2024-05-30 DIAGNOSIS — R293 Abnormal posture: Secondary | ICD-10-CM | POA: Diagnosis not present

## 2024-05-30 DIAGNOSIS — M9903 Segmental and somatic dysfunction of lumbar region: Secondary | ICD-10-CM | POA: Diagnosis not present

## 2024-05-30 DIAGNOSIS — M9905 Segmental and somatic dysfunction of pelvic region: Secondary | ICD-10-CM | POA: Diagnosis not present

## 2024-05-30 DIAGNOSIS — M9902 Segmental and somatic dysfunction of thoracic region: Secondary | ICD-10-CM | POA: Diagnosis not present

## 2024-05-30 DIAGNOSIS — M9904 Segmental and somatic dysfunction of sacral region: Secondary | ICD-10-CM | POA: Diagnosis not present

## 2024-05-30 DIAGNOSIS — M47816 Spondylosis without myelopathy or radiculopathy, lumbar region: Secondary | ICD-10-CM | POA: Diagnosis not present

## 2024-05-30 DIAGNOSIS — M9901 Segmental and somatic dysfunction of cervical region: Secondary | ICD-10-CM | POA: Diagnosis not present

## 2024-05-30 DIAGNOSIS — M4723 Other spondylosis with radiculopathy, cervicothoracic region: Secondary | ICD-10-CM | POA: Diagnosis not present

## 2024-06-01 ENCOUNTER — Encounter (HOSPITAL_BASED_OUTPATIENT_CLINIC_OR_DEPARTMENT_OTHER): Payer: Self-pay | Admitting: Cardiovascular Disease

## 2024-06-01 ENCOUNTER — Ambulatory Visit (HOSPITAL_BASED_OUTPATIENT_CLINIC_OR_DEPARTMENT_OTHER): Admitting: Cardiovascular Disease

## 2024-06-01 ENCOUNTER — Other Ambulatory Visit (INDEPENDENT_AMBULATORY_CARE_PROVIDER_SITE_OTHER): Payer: Self-pay

## 2024-06-01 VITALS — BP 122/78 | HR 64 | Ht 65.0 in | Wt 145.2 lb

## 2024-06-01 DIAGNOSIS — E7800 Pure hypercholesterolemia, unspecified: Secondary | ICD-10-CM

## 2024-06-01 DIAGNOSIS — R0609 Other forms of dyspnea: Secondary | ICD-10-CM

## 2024-06-01 DIAGNOSIS — R000 Tachycardia, unspecified: Secondary | ICD-10-CM | POA: Diagnosis not present

## 2024-06-01 DIAGNOSIS — I498 Other specified cardiac arrhythmias: Secondary | ICD-10-CM | POA: Diagnosis not present

## 2024-06-01 LAB — CBC WITH DIFFERENTIAL/PLATELET
Basophils Absolute: 0 x10E3/uL (ref 0.0–0.2)
Basos: 1 %
EOS (ABSOLUTE): 0.1 x10E3/uL (ref 0.0–0.4)
Eos: 1 %
Hematocrit: 47.1 % — ABNORMAL HIGH (ref 34.0–46.6)
Hemoglobin: 15.1 g/dL (ref 11.1–15.9)
Immature Grans (Abs): 0 x10E3/uL (ref 0.0–0.1)
Immature Granulocytes: 0 %
Lymphocytes Absolute: 1.6 x10E3/uL (ref 0.7–3.1)
Lymphs: 29 %
MCH: 30.4 pg (ref 26.6–33.0)
MCHC: 32.1 g/dL (ref 31.5–35.7)
MCV: 95 fL (ref 79–97)
Monocytes Absolute: 0.3 x10E3/uL (ref 0.1–0.9)
Monocytes: 6 %
Neutrophils Absolute: 3.5 x10E3/uL (ref 1.4–7.0)
Neutrophils: 63 %
Platelets: 248 x10E3/uL (ref 150–450)
RBC: 4.97 x10E6/uL (ref 3.77–5.28)
RDW: 13.2 % (ref 11.7–15.4)
WBC: 5.5 x10E3/uL (ref 3.4–10.8)

## 2024-06-01 LAB — BASIC METABOLIC PANEL WITH GFR
BUN/Creatinine Ratio: 11 (ref 9–23)
BUN: 11 mg/dL (ref 6–24)
CO2: 26 mmol/L (ref 20–29)
Calcium: 10.5 mg/dL — ABNORMAL HIGH (ref 8.7–10.2)
Chloride: 102 mmol/L (ref 96–106)
Creatinine, Ser: 1 mg/dL (ref 0.57–1.00)
Glucose: 92 mg/dL (ref 70–99)
Potassium: 5.3 mmol/L — ABNORMAL HIGH (ref 3.5–5.2)
Sodium: 142 mmol/L (ref 134–144)
eGFR: 67 mL/min/1.73

## 2024-06-01 LAB — MAGNESIUM: Magnesium: 2.4 mg/dL — ABNORMAL HIGH (ref 1.6–2.3)

## 2024-06-01 LAB — TSH: TSH: 1.28 u[IU]/mL (ref 0.450–4.500)

## 2024-06-01 LAB — T4, FREE: Free T4: 1.21 ng/dL (ref 0.82–1.77)

## 2024-06-01 NOTE — Patient Instructions (Signed)
 Medication Instructions:  Your physician recommends that you continue on your current medications as directed. Please refer to the Current Medication list given to you today.   *If you need a refill on your cardiac medications before your next appointment, please call your pharmacy*  Lab Work: CBC/BMET/TSH/FT4/MAGNESIUM TODAY   If you have labs (blood work) drawn today and your tests are completely normal, you will receive your results only by: MyChart Message (if you have MyChart) OR A paper copy in the mail If you have any lab test that is abnormal or we need to change your treatment, we will call you to review the results.  Testing/Procedures: Your physician has requested that you have an echocardiogram. Echocardiography is a painless test that uses sound waves to create images of your heart. It provides your doctor with information about the size and shape of your heart and how well your hearts chambers and valves are working. This procedure takes approximately one hour. There are no restrictions for this procedure. Please do NOT wear cologne, perfume, aftershave, or lotions (deodorant is allowed). Please arrive 15 minutes prior to your appointment time.  Please note: We ask at that you not bring children with you during ultrasound (echo/ vascular) testing. Due to room size and safety concerns, children are not allowed in the ultrasound rooms during exams. Our front office staff cannot provide observation of children in our lobby area while testing is being conducted. An adult accompanying a patient to their appointment will only be allowed in the ultrasound room at the discretion of the ultrasound technician under special circumstances. We apologize for any inconvenience.   Follow-Up: As needed with Dr Raford Lanius have been referred to ELECTROPHYSIOLOGY IF YOU DO NOT HEAR FROM OFFICE IN 2 WEEKS PLEASE CALL TO FOLLOW UP

## 2024-06-01 NOTE — Progress Notes (Signed)
 " Cardiology Office Note:  .   Date:  06/01/2024  ID:  TERRIKA ZUVER, DOB 09-Aug-1970, MRN [id number] PCP: Montey Lot, PA-C  [facility] Providers Cardiologist:  None    History of Present Illness: .    Darlene Klein is a 53 y.o. female with a hx of hyperlipidemia, GERD, anxiety, and depression, here to for evaluation of palpitations.  She was seen 04/2021 for the evaluation of palpitations. She saw her PCP 01/2021 and reported tachycardia. She noted acute onset of weakness and exercise intolerance. Her smart watch was concerning for possible atrial fibrillation. At the time of the exam she was in sinus rhythm. She was started on rosuvastatin and referred to cardiology.  She reported feeling that her heart was out of rhythm but not fast.  Episodes have lasted as long as couple hours and made her feel very fatigued.  She was able to capture some on her smart watch and was having sinus rhythm with PACs.  She also reported exertional dyspnea.  She was referred for coronary CT-a which revealed no CAD and a calcium score of 0.  She was given metoprolol  to take as needed.  Discussed the use of AI scribe software for clinical note transcription with the patient, who gave verbal consent to proceed.  History of Present Illness SNIGDHA HOWSER is a 53 year old female who presents with changes in resting heart rate and palpitations.  Over the past month, she has experienced a decrease in her resting heart rate from her usual 70-72 beats per minute to approximately 40 beats per minute. This change is associated with feelings of tiredness and sluggishness, though she does not feel severely unwell. She also experiences a sensation of a 'forceful beat' and is aware of changes in her heart rhythm.  She monitors her heart rate using a watch and a Kardia mobile device. The issue was more pronounced around Thanksgiving but has improved somewhat since then, although she experienced it again on Monday. The episodes can  last for hours.  Her caffeine intake remains consistent, with only tea in the morning, and there have been no changes in her medications except for weight loss with Tirzepatide. She engages in regular exercise, including riding a stationary bike for 45-60 minutes several days a week and lifting weights on other days. She feels fine during exercise and can complete her workouts without issues, although she sometimes notices the palpitations during exercise.  No chest pain, pressure, changes in breathing, or swelling in her legs or feet. She mentions dealing with neuropathy, which she believes predated the current heart rate issues. Her blood pressure at home typically ranges from 100-120/60-70 mmHg. She has not noticed any additional stressors or changes in her life that could correlate with the onset of her symptoms.   ROS:  As per HPI  Studies Reviewed: SABRA   EKG Interpretation Date/Time:  Wednesday June 01 2024 11:00:46 EST Ventricular Rate:  65 PR Interval:  108 QRS Duration:  78 QT Interval:  400 QTC Calculation: 416 R Axis:   80  Text Interpretation: Sinus rhythm with short PR No previous ECGs available Confirmed by Raford Riggs ([id number]) on 06/01/2024 11:05:38 AM   Coronary CT-A 04/2021: IMPRESSION: 1. No evidence of CAD, CADRADS = 0.   2. Coronary calcium score of 0. This was 0 percentile for age and sex matched control.   3. Normal coronary origin with right dominance.  Risk Assessment/Calculations:  Physical Exam:   VS:  BP 122/78   Pulse 64   Ht 5' 5 (1.651 m)   Wt 145 lb 3.2 oz (65.9 kg)   SpO2 99%   BMI 24.16 kg/m  , BMI Body mass index is 24.16 kg/m. GENERAL:  Well appearing HEENT: Pupils equal round and reactive, fundi not visualized, oral mucosa unremarkable NECK:  No jugular venous distention, waveform within normal limits, carotid upstroke brisk and symmetric, no bruits, no thyromegaly LUNGS:  Clear to auscultation bilaterally HEART:   RRR.  PMI not displaced or sustained,S1 and S2 within normal limits, no S3, no S4, no clicks, no rubs, no murmurs ABD:  Flat, positive bowel sounds normal in frequency in pitch, no bruits, no rebound, no guarding, no midline pulsatile mass, no hepatomegaly, no splenomegaly EXT:  2 plus pulses throughout, no edema, no cyanosis no clubbing SKIN:  No rashes no nodules NEURO:  Cranial nerves II through XII grossly intact, motor grossly intact throughout PSYCH:  Cognitively intact, oriented to person place and time   ASSESSMENT AND PLAN: .    Assessment & Plan # Palpitations and premature ventricular contractions Intermittent palpitations and PVCs with bradycardia causing fatigue. Reviewed her Crist mobile strip which showed episodes of ventricular bigeminy.  No ischemic symptoms and she had no CAD on her CT-A in 2022. Good exercise tolerance. Differential includes non-ischemic causes. Risk of heart failure if PVCs >10% of heartbeats. - Ordered 14 day Zio monitor to assess PVC burden and morphology. - Ordered echocardiogram to evaluate cardiac structure. - Check TSH, CBC, CMP, and magnesium - Referred to electrophysiologist for further evaluation and management.           Dispo: f/u prn  Signed, Annabella Scarce, MD   "

## 2024-06-07 ENCOUNTER — Ambulatory Visit: Payer: Self-pay | Admitting: Cardiovascular Disease

## 2024-06-13 ENCOUNTER — Telehealth: Payer: Self-pay | Admitting: Diagnostic Neuroimaging

## 2024-06-13 NOTE — Telephone Encounter (Signed)
 Pt states appointment no longer needed

## 2024-06-17 ENCOUNTER — Ambulatory Visit: Payer: PRIVATE HEALTH INSURANCE | Admitting: Diagnostic Neuroimaging

## 2024-06-21 ENCOUNTER — Other Ambulatory Visit: Payer: Self-pay | Admitting: Physician Assistant

## 2024-06-21 DIAGNOSIS — Z1231 Encounter for screening mammogram for malignant neoplasm of breast: Secondary | ICD-10-CM

## 2024-06-22 ENCOUNTER — Encounter (HOSPITAL_BASED_OUTPATIENT_CLINIC_OR_DEPARTMENT_OTHER): Payer: Self-pay | Admitting: Cardiovascular Disease

## 2024-06-27 DIAGNOSIS — R0609 Other forms of dyspnea: Secondary | ICD-10-CM

## 2024-06-27 DIAGNOSIS — R000 Tachycardia, unspecified: Secondary | ICD-10-CM

## 2024-07-05 ENCOUNTER — Ambulatory Visit

## 2024-07-07 ENCOUNTER — Ambulatory Visit (HOSPITAL_COMMUNITY)
Admission: RE | Admit: 2024-07-07 | Discharge: 2024-07-07 | Disposition: A | Discharge status: Home / Self Care | Payer: Self-pay | Source: Ambulatory Visit | Attending: Cardiology | Admitting: Cardiology

## 2024-07-07 DIAGNOSIS — R000 Tachycardia, unspecified: Secondary | ICD-10-CM | POA: Diagnosis present

## 2024-07-07 DIAGNOSIS — R0609 Other forms of dyspnea: Secondary | ICD-10-CM | POA: Diagnosis present

## 2024-07-07 LAB — ECHOCARDIOGRAM COMPLETE
Area-P 1/2: 4.07 cm2
S' Lateral: 2.5 cm

## 2024-07-07 NOTE — Progress Notes (Signed)
 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  Discussed the use of AI scribe software for clinical note transcription with the patient, who gave verbal consent to proceed.  History of Present Illness Darlene Klein is a 54 year old female who presents with episodes of low heart rate and palpitations. She is accompanied by her husband. She was referred by Dr. Raford for evaluation based on the results of a heart monitor.  She experiences episodes of low heart rate, approximately 40 bpm, first noticed around Thanksgiving. These episodes were alerted by her watch but did not occur during the time she wore the heart monitor and have not recurred since.  She experiences palpitations described as 'pounding' sensations, particularly when lying down or during work. These episodes are variable, with some days being symptom-free and others having frequent occurrences throughout the day.  She has a history of wearing a heart monitor which showed a low burden of premature ventricular contractions (PVCs) at 1.6%.  She has completed menopause and notes that her blood pressure occasionally runs low, sometimes measuring 100-105/70-65 mmHg.  She was previously given metoprolol  for a CT scan but does not take it regularly.   Review of systems complete and found to be negative unless listed in HPI.   EP Information / Studies Reviewed:       Zio 06/01/24   Echo 07/07/24:  1. Left ventricular ejection fraction, by estimation, is 60 to 65%. Left  ventricular ejection fraction by 3D volume is 63 %. The left ventricle has  normal function. The left ventricle has no regional wall motion  abnormalities. Left ventricular diastolic   parameters were  normal. The average left ventricular global longitudinal  strain is -23.1 %. The global longitudinal strain is normal.   2. Right ventricular systolic function is normal. The right ventricular  size is normal.   3. The mitral valve is normal in structure. No evidence of mitral valve  regurgitation. No evidence of mitral stenosis.   4. The aortic valve is normal in structure. Aortic valve regurgitation is  not visualized. No aortic stenosis is present.   5. The inferior vena cava is normal in size with greater than 50%  respiratory variability, suggesting right atrial pressure of 3 mmHg.    Physical Exam:   VS:  There were no vitals taken for this visit.   Wt Readings from Last 3 Encounters:  06/01/24 145 lb 3.2 oz (65.9 kg)  06/27/20 154 lb (69.9 kg)  06/13/20 154 lb (69.9 kg)     General: Well developed, in no acute distress.  Neck: No JVD.  Cardiac: Normal rate, regular rhythm.  Resp: Normal work of breathing.  Ext: No edema.  Neuro: No gross focal deficits.  Psych: Normal affect.   ASSESSMENT AND PLAN:    #Palpitations #PVCs: Burden 1.6% on Zio. EF is normal. No signs or symptoms of clinical heart failure.  - Given her very low burden, risks of anti-arrhythmic drug therapy or catheter ablation likely outweigh the benefits. We discussed scheduled vs as needed beta blocker use. Patient intends to use short acting metoprolol  on days when palpitations are significant. I feel this is a reasonable strategy for now. Can increase to  a scheduled regimen if burden increases.    Follow up with general cardiology regularly. EP on an as needed basis.  Signed, Fonda Kitty, MD  "

## 2024-07-08 ENCOUNTER — Encounter: Payer: Self-pay | Admitting: Cardiology

## 2024-07-08 ENCOUNTER — Ambulatory Visit: Payer: Self-pay | Attending: Cardiology | Admitting: Cardiology

## 2024-07-08 VITALS — BP 118/80 | HR 76 | Ht 65.0 in | Wt 144.0 lb

## 2024-07-08 DIAGNOSIS — R002 Palpitations: Secondary | ICD-10-CM

## 2024-07-08 DIAGNOSIS — I493 Ventricular premature depolarization: Secondary | ICD-10-CM

## 2024-07-08 MED ORDER — METOPROLOL TARTRATE 25 MG PO TABS: 25.0000 mg | ORAL_TABLET | Freq: Three times a day (TID) | ORAL | 3 refills | Status: AC | PRN

## 2024-07-08 NOTE — Patient Instructions (Signed)
 Medication Instructions:  Your physician has recommended you make the following change in your medication:  1) START taking metoprolol  tartrate 25 mg three times daily as needed   *If you need a refill on your cardiac medications before your next appointment, please call your pharmacy*  Follow-Up: At [facility], you and your health needs are our priority.  As part of our continuing mission to provide you with exceptional heart care, our providers are all part of one team.  This team includes your primary Cardiologist (physician) and Advanced Practice Providers or APPs (Physician Assistants and Nurse Practitioners) who all work together to provide you with the care you need, when you need it.  As needed with Dr. Kennyth

## 2024-07-12 ENCOUNTER — Ambulatory Visit

## 2024-07-19 ENCOUNTER — Ambulatory Visit
